# Patient Record
Sex: Female | Born: 1940 | Race: White | Hispanic: No | Marital: Married | Smoking: Former smoker
Health system: Southern US, Community
[De-identification: ages and names within clinical notes are randomized; demographics above are authoritative.]

## PROBLEM LIST (undated history)

## (undated) DIAGNOSIS — E785 Hyperlipidemia, unspecified: Secondary | ICD-10-CM

## (undated) DIAGNOSIS — E559 Vitamin D deficiency, unspecified: Secondary | ICD-10-CM

## (undated) DIAGNOSIS — D649 Anemia, unspecified: Secondary | ICD-10-CM

## (undated) DIAGNOSIS — B0050 Herpesviral ocular disease, unspecified: Secondary | ICD-10-CM

## (undated) DIAGNOSIS — M899 Disorder of bone, unspecified: Secondary | ICD-10-CM

## (undated) DIAGNOSIS — K219 Gastro-esophageal reflux disease without esophagitis: Secondary | ICD-10-CM

## (undated) DIAGNOSIS — F419 Anxiety disorder, unspecified: Principal | ICD-10-CM

## (undated) DIAGNOSIS — I2510 Atherosclerotic heart disease of native coronary artery without angina pectoris: Secondary | ICD-10-CM

## (undated) DIAGNOSIS — N200 Calculus of kidney: Secondary | ICD-10-CM

## (undated) DIAGNOSIS — M1990 Unspecified osteoarthritis, unspecified site: Secondary | ICD-10-CM

## (undated) DIAGNOSIS — M545 Low back pain: Secondary | ICD-10-CM

## (undated) DIAGNOSIS — I10 Essential (primary) hypertension: Secondary | ICD-10-CM

## (undated) DIAGNOSIS — M6740 Ganglion, unspecified site: Secondary | ICD-10-CM

## (undated) DIAGNOSIS — E039 Hypothyroidism, unspecified: Secondary | ICD-10-CM

## (undated) DIAGNOSIS — M949 Disorder of cartilage, unspecified: Secondary | ICD-10-CM

## (undated) DIAGNOSIS — L700 Acne vulgaris: Secondary | ICD-10-CM

## (undated) HISTORY — DX: Acne vulgaris: L70.0

## (undated) HISTORY — DX: Gastro-esophageal reflux disease without esophagitis: K21.9

## (undated) HISTORY — PX: CHOLECYSTECTOMY: SHX55

## (undated) HISTORY — DX: Ganglion, unspecified site: M67.40

## (undated) HISTORY — PX: ANGIOPLASTY: SHX39

## (undated) HISTORY — DX: Disorder of cartilage, unspecified: M94.9

## (undated) HISTORY — DX: Herpesviral ocular disease, unspecified: B00.50

## (undated) HISTORY — DX: Calculus of kidney: N20.0

## (undated) HISTORY — DX: Atherosclerotic heart disease of native coronary artery without angina pectoris: I25.10

## (undated) HISTORY — DX: Low back pain: M54.5

## (undated) HISTORY — DX: Disorder of bone, unspecified: M89.9

## (undated) HISTORY — DX: Vitamin D deficiency, unspecified: E55.9

## (undated) HISTORY — DX: Hypothyroidism, unspecified: E03.9

## (undated) HISTORY — DX: Essential (primary) hypertension: I10

## (undated) HISTORY — DX: Anemia, unspecified: D64.9

## (undated) HISTORY — DX: Unspecified osteoarthritis, unspecified site: M19.90

## (undated) HISTORY — DX: Hyperlipidemia, unspecified: E78.5

## (undated) HISTORY — DX: Anxiety disorder, unspecified: F41.9

---

## 2004-08-19 IMAGING — CR DG CHEST 2V
2 series · 2 of 2 positions shown · non-contrast
Comparison: none

CLINICAL DATA: Infected right elbow, preop respiratory exam

Chest 2 view

[view not recorded (1 of 2)]
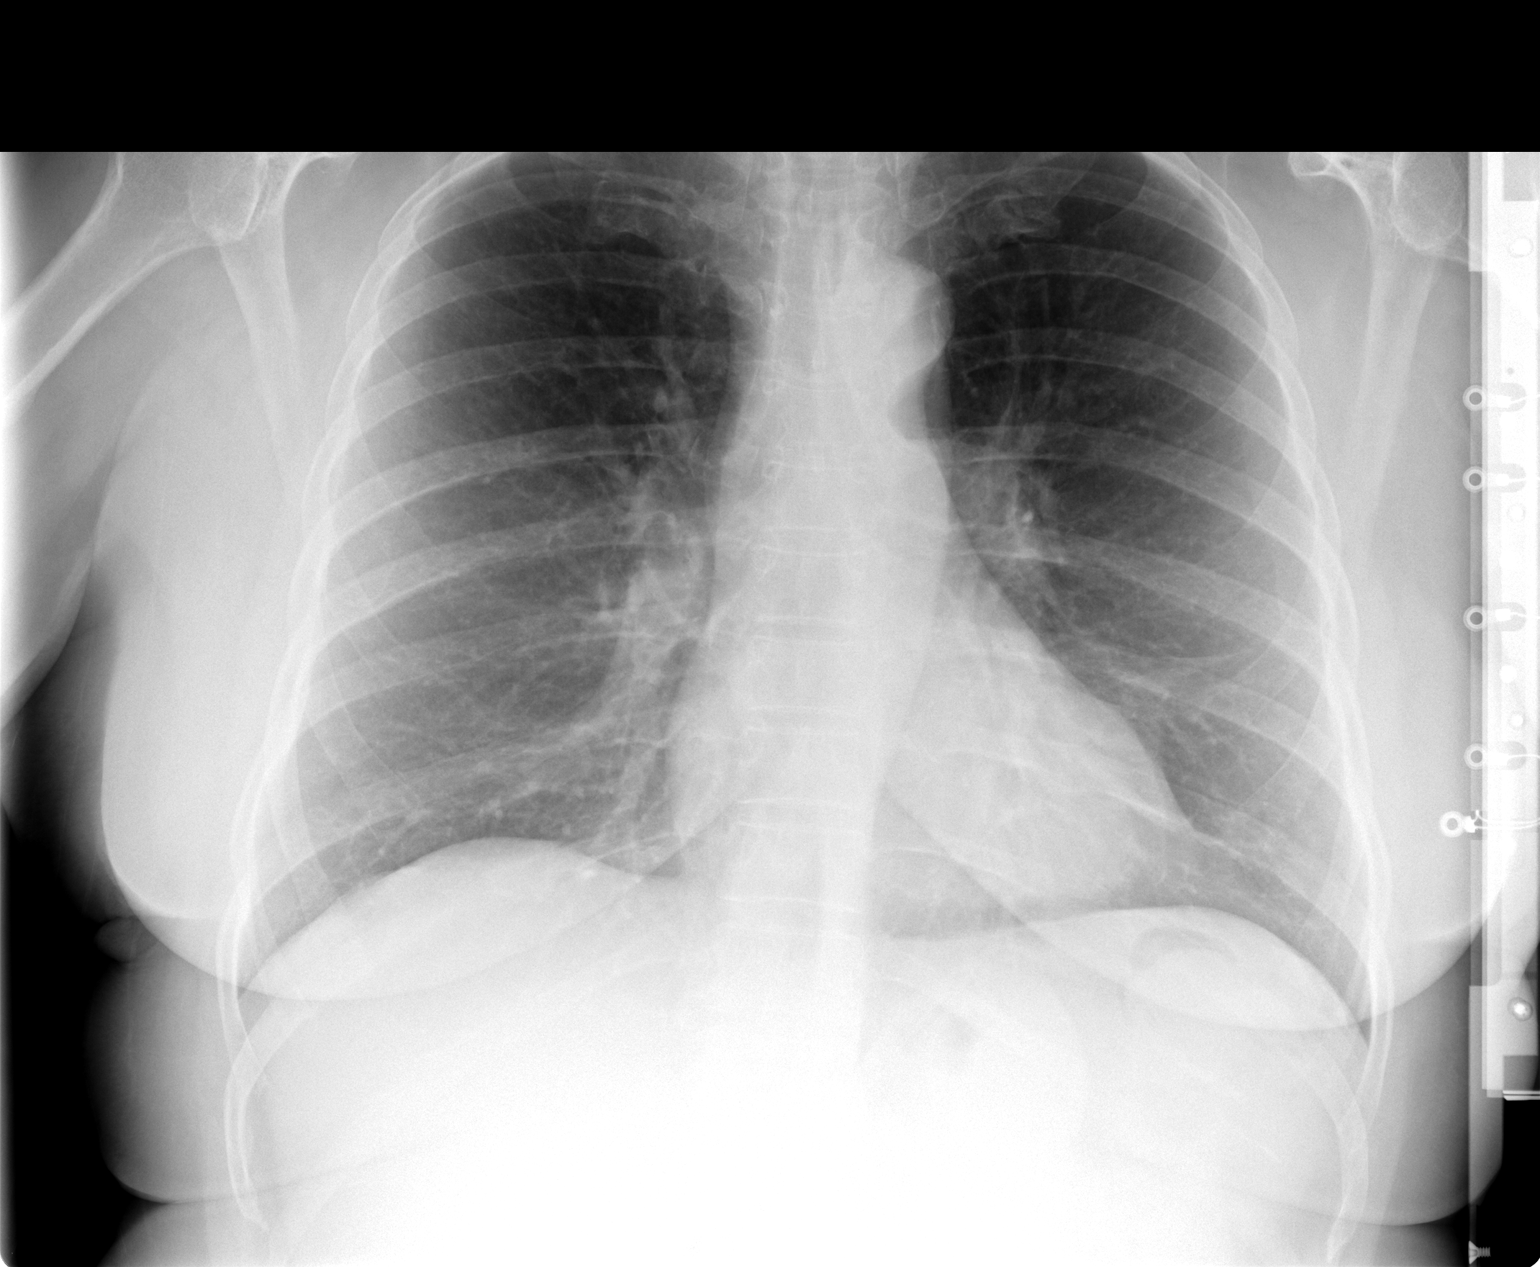

[view not recorded (2 of 2)]
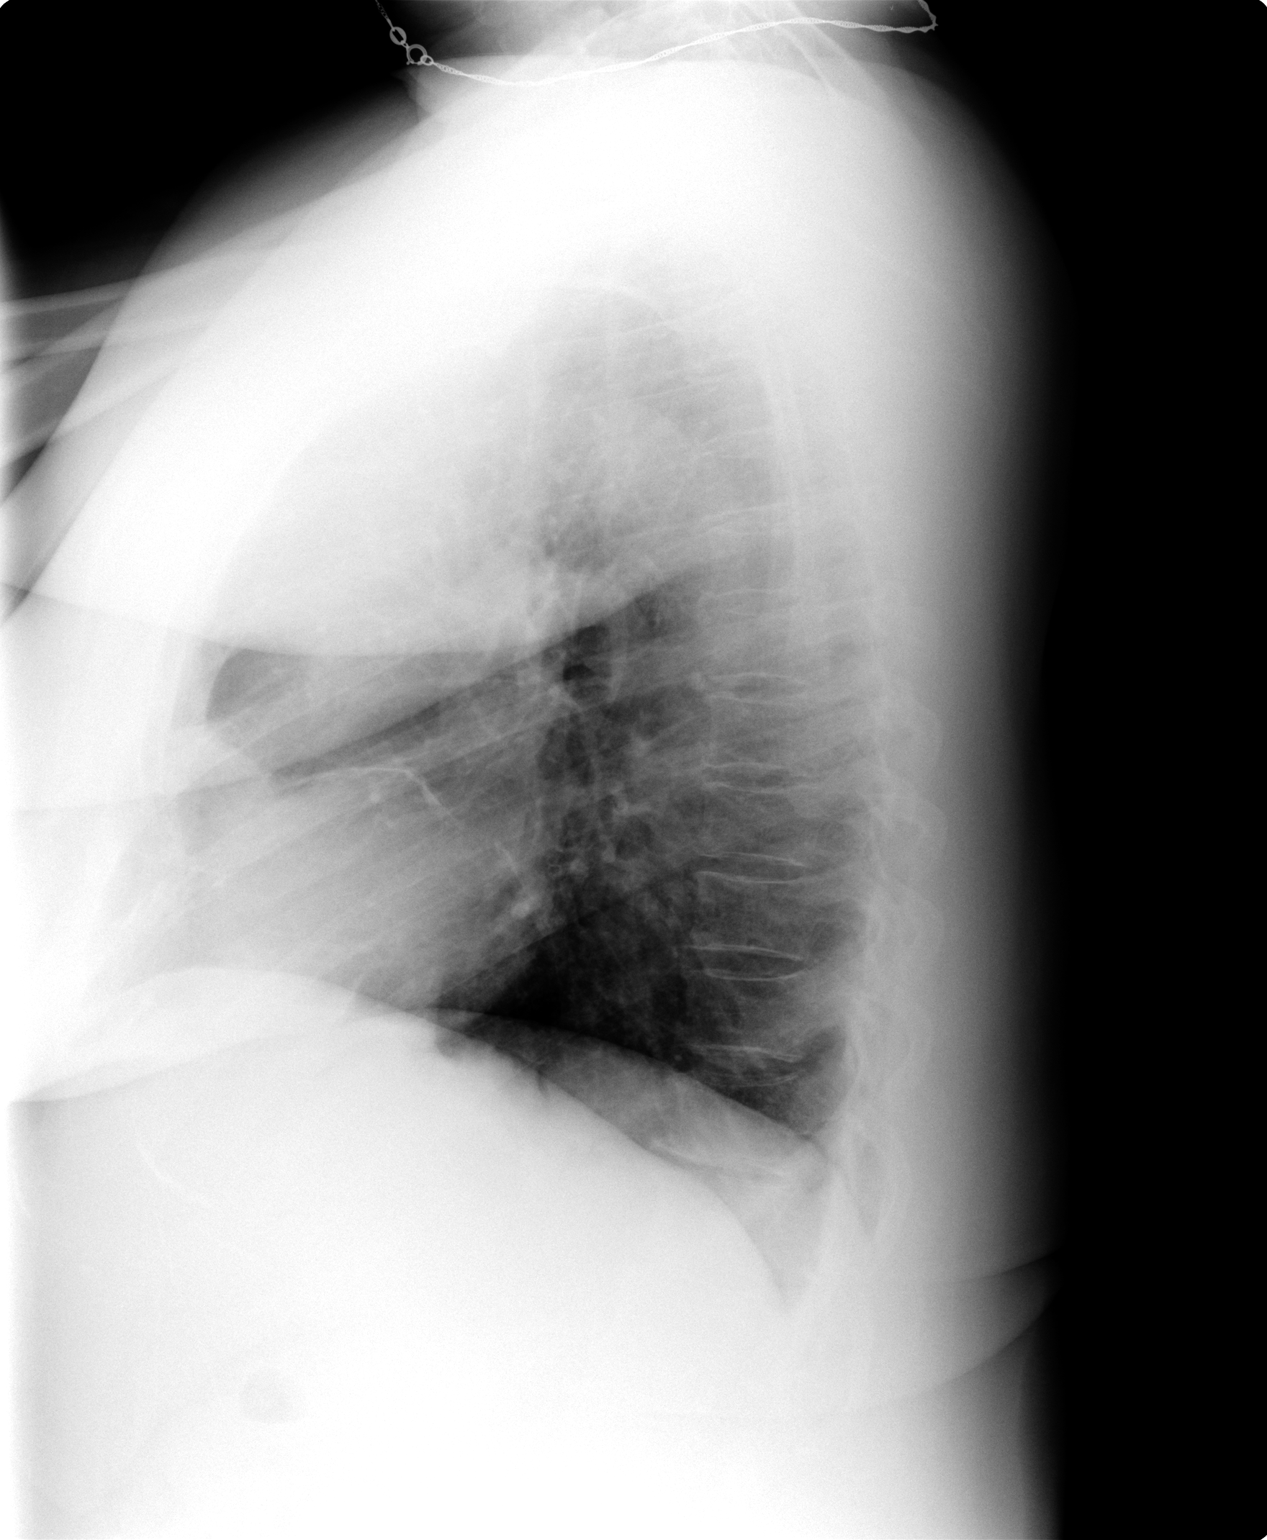

[2 of 2 positions shown; findings below may reference images not displayed]

FINDINGS: No previous for comparison. Linear scarring or atelectasis in the lingula. Heart size
normal. Mildly tortuous thoracic aorta. No effusion.
IMPRESSION: Minimal linear lingular scarring or atelectasis.

## 2004-08-21 ENCOUNTER — Inpatient Hospital Stay (HOSPITAL_COMMUNITY): Admission: RE | Admit: 2004-08-21 | Discharge: 2004-08-25 | Payer: Self-pay | Admitting: Orthopedic Surgery

## 2004-08-21 ENCOUNTER — Ambulatory Visit: Payer: Self-pay | Admitting: Infectious Diseases

## 2004-08-22 ENCOUNTER — Encounter: Payer: Self-pay | Admitting: Cardiovascular Disease

## 2005-10-20 ENCOUNTER — Ambulatory Visit: Payer: Self-pay | Admitting: Family Medicine

## 2005-11-03 ENCOUNTER — Other Ambulatory Visit: Admission: RE | Admit: 2005-11-03 | Discharge: 2005-11-03 | Payer: Self-pay | Admitting: Family Medicine

## 2005-11-03 ENCOUNTER — Encounter: Payer: Self-pay | Admitting: Family Medicine

## 2005-11-03 ENCOUNTER — Ambulatory Visit: Payer: Self-pay | Admitting: Family Medicine

## 2005-12-03 ENCOUNTER — Encounter: Admission: RE | Admit: 2005-12-03 | Discharge: 2005-12-03 | Payer: Self-pay | Admitting: Family Medicine

## 2005-12-08 ENCOUNTER — Ambulatory Visit: Payer: Self-pay | Admitting: Family Medicine

## 2005-12-10 ENCOUNTER — Encounter: Admission: RE | Admit: 2005-12-10 | Discharge: 2005-12-10 | Payer: Self-pay | Admitting: Family Medicine

## 2006-04-27 ENCOUNTER — Ambulatory Visit: Payer: Self-pay | Admitting: Family Medicine

## 2006-07-13 ENCOUNTER — Ambulatory Visit: Payer: Self-pay | Admitting: Family Medicine

## 2006-10-05 ENCOUNTER — Ambulatory Visit: Payer: Self-pay | Admitting: Family Medicine

## 2006-11-18 ENCOUNTER — Ambulatory Visit: Payer: Self-pay | Admitting: Family Medicine

## 2006-11-18 LAB — CONVERTED CEMR LAB
ALT: 21 units/L (ref 0–40)
AST: 24 units/L (ref 0–37)
BUN: 12 mg/dL (ref 6–23)
Basophils Absolute: 0.1 10*3/uL (ref 0.0–0.1)
Basophils Relative: 0.8 % (ref 0.0–1.0)
CO2: 26 meq/L (ref 19–32)
Calcium: 9.9 mg/dL (ref 8.4–10.5)
Chloride: 105 meq/L (ref 96–112)
Creatinine, Ser: 0.7 mg/dL (ref 0.4–1.2)
Glomerular Filtration Rate, Af Am: 109 mL/min/{1.73_m2}
HDL: 30.5 mg/dL — ABNORMAL LOW (ref >39.0)
Hemoglobin: 14.2 g/dL (ref 12.0–15.0)
LDL DIRECT: 191.6 mg/dL
MCV: 94.8 fL (ref 78.0–100.0)
Monocytes Relative: 6.4 % (ref 3.0–11.0)
Neutrophils Relative %: 66.8 % (ref 43.0–77.0)

## 2006-12-15 ENCOUNTER — Encounter: Admission: RE | Admit: 2006-12-15 | Discharge: 2006-12-15 | Payer: Self-pay | Admitting: Family Medicine

## 2007-02-18 ENCOUNTER — Ambulatory Visit: Payer: Self-pay | Admitting: Family Medicine

## 2007-02-18 LAB — CONVERTED CEMR LAB
ALT: 23 units/L (ref 0–40)
AST: 29 units/L (ref 0–37)
Total CHOL/HDL Ratio: 6.7
Triglycerides: 57 mg/dL (ref 0–149)

## 2007-06-07 ENCOUNTER — Ambulatory Visit: Payer: Self-pay | Admitting: Family Medicine

## 2007-06-07 LAB — CONVERTED CEMR LAB
ALT: 26 units/L (ref 0–35)
Direct LDL: 245.4 mg/dL
HDL: 31.2 mg/dL — ABNORMAL LOW (ref >39.0)
Triglycerides: 115 mg/dL (ref 0–149)

## 2007-07-12 ENCOUNTER — Telehealth: Payer: Self-pay | Admitting: Family Medicine

## 2007-10-11 ENCOUNTER — Telehealth: Payer: Self-pay | Admitting: Family Medicine

## 2007-11-09 ENCOUNTER — Telehealth: Payer: Self-pay | Admitting: Family Medicine

## 2007-12-20 ENCOUNTER — Encounter: Payer: Self-pay | Admitting: Family Medicine

## 2007-12-27 ENCOUNTER — Encounter: Admission: RE | Admit: 2007-12-27 | Discharge: 2007-12-27 | Payer: Self-pay | Admitting: Family Medicine

## 2008-05-08 ENCOUNTER — Telehealth: Payer: Self-pay | Admitting: Family Medicine

## 2008-07-31 ENCOUNTER — Telehealth: Payer: Self-pay | Admitting: Family Medicine

## 2008-08-23 ENCOUNTER — Ambulatory Visit: Payer: Self-pay | Admitting: Family Medicine

## 2008-08-23 ENCOUNTER — Other Ambulatory Visit: Admission: RE | Admit: 2008-08-23 | Discharge: 2008-08-23 | Payer: Self-pay | Admitting: Family Medicine

## 2008-08-23 ENCOUNTER — Encounter: Payer: Self-pay | Admitting: Family Medicine

## 2008-08-23 DIAGNOSIS — N390 Urinary tract infection, site not specified: Secondary | ICD-10-CM | POA: Insufficient documentation

## 2008-08-23 DIAGNOSIS — M899 Disorder of bone, unspecified: Secondary | ICD-10-CM | POA: Insufficient documentation

## 2008-08-23 DIAGNOSIS — E785 Hyperlipidemia, unspecified: Secondary | ICD-10-CM

## 2008-08-23 DIAGNOSIS — E782 Mixed hyperlipidemia: Secondary | ICD-10-CM | POA: Insufficient documentation

## 2008-08-23 DIAGNOSIS — D649 Anemia, unspecified: Secondary | ICD-10-CM

## 2008-08-23 DIAGNOSIS — T50995A Adverse effect of other drugs, medicaments and biological substances, initial encounter: Secondary | ICD-10-CM

## 2008-08-23 DIAGNOSIS — E039 Hypothyroidism, unspecified: Secondary | ICD-10-CM

## 2008-08-23 DIAGNOSIS — N951 Menopausal and female climacteric states: Secondary | ICD-10-CM | POA: Insufficient documentation

## 2008-08-23 DIAGNOSIS — M8580 Other specified disorders of bone density and structure, unspecified site: Secondary | ICD-10-CM | POA: Insufficient documentation

## 2008-08-23 DIAGNOSIS — K219 Gastro-esophageal reflux disease without esophagitis: Secondary | ICD-10-CM

## 2008-08-23 DIAGNOSIS — M949 Disorder of cartilage, unspecified: Secondary | ICD-10-CM

## 2008-08-23 DIAGNOSIS — I2510 Atherosclerotic heart disease of native coronary artery without angina pectoris: Secondary | ICD-10-CM

## 2008-08-23 HISTORY — DX: Anemia, unspecified: D64.9

## 2008-08-23 HISTORY — DX: Atherosclerotic heart disease of native coronary artery without angina pectoris: I25.10

## 2008-08-23 HISTORY — DX: Hyperlipidemia, unspecified: E78.5

## 2008-08-23 HISTORY — DX: Disorder of bone, unspecified: M89.9

## 2008-08-23 HISTORY — DX: Hypothyroidism, unspecified: E03.9

## 2008-08-23 HISTORY — DX: Gastro-esophageal reflux disease without esophagitis: K21.9

## 2008-08-24 LAB — CONVERTED CEMR LAB: Vit D, 1,25-Dihydroxy: 24 — ABNORMAL LOW (ref 30–89)

## 2008-09-05 LAB — CONVERTED CEMR LAB
AST: 25 units/L (ref 0–37)
Albumin: 4 g/dL (ref 3.5–5.2)
Alkaline Phosphatase: 92 units/L (ref 39–117)
BUN: 15 mg/dL (ref 6–23)
Basophils Absolute: 0 10*3/uL (ref 0.0–0.1)
Calcium: 9.4 mg/dL (ref 8.4–10.5)
GFR calc non Af Amer: 89 mL/min
HDL: 28.8 mg/dL — ABNORMAL LOW (ref >39.0)
Hemoglobin: 13.6 g/dL (ref 12.0–15.0)
MCV: 94.5 fL (ref 78.0–100.0)
Monocytes Absolute: 0.6 10*3/uL (ref 0.1–1.0)
Neutrophils Relative %: 54.7 % (ref 43.0–77.0)
Potassium: 4 meq/L (ref 3.5–5.1)
RDW: 14 % (ref 11.5–14.6)
Total CHOL/HDL Ratio: 7.7
Total Protein: 7 g/dL (ref 6.0–8.3)
Triglycerides: 141 mg/dL (ref 0–149)

## 2008-09-11 ENCOUNTER — Telehealth: Payer: Self-pay | Admitting: Family Medicine

## 2008-09-18 ENCOUNTER — Telehealth: Payer: Self-pay | Admitting: Family Medicine

## 2008-11-08 ENCOUNTER — Telehealth: Payer: Self-pay | Admitting: Family Medicine

## 2008-11-28 ENCOUNTER — Ambulatory Visit: Payer: Self-pay | Admitting: Family Medicine

## 2008-11-28 DIAGNOSIS — J209 Acute bronchitis, unspecified: Secondary | ICD-10-CM | POA: Insufficient documentation

## 2008-11-28 DIAGNOSIS — J029 Acute pharyngitis, unspecified: Secondary | ICD-10-CM

## 2008-12-05 ENCOUNTER — Ambulatory Visit: Payer: Self-pay | Admitting: Family Medicine

## 2008-12-05 DIAGNOSIS — K5289 Other specified noninfective gastroenteritis and colitis: Secondary | ICD-10-CM

## 2008-12-26 ENCOUNTER — Encounter: Payer: Self-pay | Admitting: Family Medicine

## 2009-01-03 ENCOUNTER — Telehealth: Payer: Self-pay | Admitting: Family Medicine

## 2009-01-15 ENCOUNTER — Encounter: Admission: RE | Admit: 2009-01-15 | Discharge: 2009-01-15 | Payer: Self-pay | Admitting: Family Medicine

## 2009-09-25 ENCOUNTER — Telehealth: Payer: Self-pay | Admitting: Family Medicine

## 2009-10-08 ENCOUNTER — Telehealth: Payer: Self-pay | Admitting: Family Medicine

## 2010-01-20 ENCOUNTER — Encounter: Admission: RE | Admit: 2010-01-20 | Discharge: 2010-01-20 | Payer: Self-pay | Admitting: Family Medicine

## 2010-01-20 LAB — HM MAMMOGRAPHY

## 2010-01-21 ENCOUNTER — Ambulatory Visit: Payer: Self-pay | Admitting: Family Medicine

## 2010-01-21 DIAGNOSIS — E669 Obesity, unspecified: Secondary | ICD-10-CM | POA: Insufficient documentation

## 2010-02-03 ENCOUNTER — Encounter: Payer: Self-pay | Admitting: Family Medicine

## 2010-02-03 ENCOUNTER — Encounter: Admission: RE | Admit: 2010-02-03 | Discharge: 2010-02-03 | Payer: Self-pay | Admitting: Family Medicine

## 2010-02-18 ENCOUNTER — Other Ambulatory Visit: Admission: RE | Admit: 2010-02-18 | Discharge: 2010-02-18 | Payer: Self-pay | Admitting: Family Medicine

## 2010-02-18 ENCOUNTER — Ambulatory Visit: Payer: Self-pay | Admitting: Family Medicine

## 2010-02-18 DIAGNOSIS — M545 Low back pain, unspecified: Secondary | ICD-10-CM

## 2010-02-18 HISTORY — DX: Low back pain, unspecified: M54.50

## 2010-02-18 LAB — CONVERTED CEMR LAB
Bilirubin Urine: NEGATIVE
pH: 5.5

## 2010-02-20 LAB — CONVERTED CEMR LAB
ALT: 21 units/L (ref 0–35)
Albumin: 3.8 g/dL (ref 3.5–5.2)
Basophils Absolute: 0.1 10*3/uL (ref 0.0–0.1)
Basophils Relative: 0.9 % (ref 0.0–3.0)
Bilirubin, Direct: 0.1 mg/dL (ref 0.0–0.3)
Creatinine, Ser: 0.5 mg/dL (ref 0.4–1.2)
Eosinophils Absolute: 0.2 10*3/uL (ref 0.0–0.7)
Eosinophils Relative: 2.8 % (ref 0.0–5.0)
GFR calc non Af Amer: 130.92 mL/min (ref >60)
Glucose, Bld: 89 mg/dL (ref 70–99)
HDL: 35.6 mg/dL — ABNORMAL LOW (ref >39.00)
Lymphocytes Relative: 29.3 % (ref 12.0–46.0)
Lymphs Abs: 1.9 10*3/uL (ref 0.7–4.0)
MCHC: 33.7 g/dL (ref 30.0–36.0)
Monocytes Absolute: 0.5 10*3/uL (ref 0.1–1.0)
Monocytes Relative: 8.2 % (ref 3.0–12.0)
Neutrophils Relative %: 58.8 % (ref 43.0–77.0)
Platelets: 239 10*3/uL (ref 150.0–400.0)
Potassium: 4 meq/L (ref 3.5–5.1)
RDW: 13.1 % (ref 11.5–14.6)
Total Bilirubin: 0.5 mg/dL (ref 0.3–1.2)
Total CHOL/HDL Ratio: 6
Total Protein: 7.1 g/dL (ref 6.0–8.3)
VLDL: 40.2 mg/dL — ABNORMAL HIGH (ref 0.0–40.0)
Vit D, 25-Hydroxy: 19 ng/mL — ABNORMAL LOW (ref 30–89)

## 2010-02-25 ENCOUNTER — Encounter: Payer: Self-pay | Admitting: Family Medicine

## 2010-06-04 ENCOUNTER — Telehealth: Payer: Self-pay | Admitting: Family Medicine

## 2010-06-10 ENCOUNTER — Telehealth: Payer: Self-pay | Admitting: Family Medicine

## 2010-06-17 ENCOUNTER — Telehealth: Payer: Self-pay | Admitting: Family Medicine

## 2010-07-31 ENCOUNTER — Telehealth: Payer: Self-pay | Admitting: Family Medicine

## 2010-08-19 ENCOUNTER — Encounter: Payer: Self-pay | Admitting: Family Medicine

## 2010-08-21 ENCOUNTER — Encounter: Payer: Self-pay | Admitting: Family Medicine

## 2010-08-23 HISTORY — PX: CORONARY STENT PLACEMENT: SHX1402

## 2010-09-15 ENCOUNTER — Encounter: Payer: Self-pay | Admitting: Family Medicine

## 2010-12-14 ENCOUNTER — Encounter: Payer: Self-pay | Admitting: Family Medicine

## 2010-12-24 NOTE — Progress Notes (Signed)
Summary: Pt called and said Estradol not strong enough.   Phone Note Call from Patient Call back at [facility] Phone [phone]   Caller: Patient Summary of Call: Pt called and said that the Katrina Stack is not strong enough. Pt has taken 2 at a time and it still doesn't help hot flashes.  Pt req a different med that is stronger.  [facility]s [facility] in to [facility] [phone]     Initial call taken by: Lucy Antigua,  June 10, 2010 1:37 PM  Follow-up for Phone Call        per dr Scotty Court ok to take 2 once daily and he said give it at least 3 weeks before deciding it doesn't work  Follow-up by: Pura Spice, RN,  June 10, 2010 2:45 PM

## 2010-12-24 NOTE — Assessment & Plan Note (Signed)
Summary: MED CK (REFILLS) // RS appt 10.30am/njr   Vital Signs:  Patient profile:   70 year old female Weight:      188 pounds O2 Sat:      98 % Temp:     98.1 degrees F Pulse rate:   70 / minute Pulse rhythm:   regular BP sitting:   160 / 92  (left arm) Cuff size:   large  Vitals Entered By: Pura Spice, RN (January 21, 2010 9:38 AM) CC: wants  refills  Is Patient Diabetic? 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  Social History: Last updated: 06/14/2007 Retired Married Alcohol use-yes Drug use-no Regular exercise-yes  Risk Factors: Smoking Status: quit > 6 months (01/21/2010)  Social History: Smoking Status:  quit > 6 months  Review of Systems  The patient denies anorexia, fever, weight loss, weight gain, vision loss, decreased hearing, hoarseness, chest pain, syncope, dyspnea on exertion, peripheral edema, prolonged cough, headaches, hemoptysis, abdominal pain, melena, hematochezia, severe indigestion/heartburn, hematuria, incontinence, genital sores, muscle weakness, suspicious skin lesions, transient blindness, difficulty walking, depression, unusual weight change, abnormal bleeding, enlarged lymph nodes, angioedema, breast masses, and testicular masses.    Physical Exam  General:  Well-developed,well-nourished,in no acute distress; alert,appropriate and cooperative throughout examination Lungs:  Normal respiratory  effort, chest expands symmetrically. Lungs are clear to auscultation, no crackles or wheezes. Heart:  Normal rate and regular rhythm. S1 and S2 normal without gallop, murmur, click, rub or other extra sounds. Extremities:  No clubbing, cyanosis, edema, or deformity noted with normal full range of motion of all joints.     Impression & Recommendations:  Problem # 1:  EXOGENOUS OBESITY (ICD-278.00) Assessment New discussed weight reduction and to prescribe phentermine 37.5 mg q.d. for 3 months decreased appetite  Problem # 2:  GERD (ICD-530.81) Assessment: Improved  Problem # 3:  CORONARY ARTERY DISEASE (ICD-414.00) Assessment: Improved  The following medications were removed from the medication list:    Atenolol 50 Mg Tabs (Atenolol) .Marland Kitchen... Take 1 tablet by mouth once a day Her updated medication list for this problem includes:    Cartia Xt 240 Mg Cp24 (Diltiazem hcl coated beads) ..... Once daily    Adult Aspirin Ec Low Strength 81 Mg Tbec (Aspirin) .Marland Kitchen... 1 qd    Atenolol 50 Mg Tabs (Atenolol) .Marland Kitchen... 1 qd  Problem # 4:  HYPERLIPIDEMIA (ICD-272.4) Assessment: Unchanged  The following medications were removed from the medication list:    Zetia 10 Mg Tabs (Ezetimibe) .Marland Kitchen... 1 by mouth once daily Her updated medication list for this problem includes:    Simvastatin 80 Mg Tabs (Simvastatin) .Marland Kitchen... 1 by mouth once daily  Complete Medication List: 1)  Phentermine Hcl 37.5 Mg Caps (Phentermine hcl) .... Once daily to decrease appetite 2)  Acyclovir 400 Mg Tabs (Acyclovir) .... Two times a day 3)  Cartia Xt 240 Mg Cp24 (Diltiazem hcl coated beads) .... Once daily 4)  Diclofenac Sodium 75 Mg Tbec (Diclofenac sodium) .Marland KitchenMarland KitchenMarland Kitchen  1 two times a day pc 5)  Adult Aspirin Ec Low Strength 81 Mg Tbec (Aspirin) .Marland Kitchen.. 1 qd 6)  Lorcet Plus 7.5-650 Mg Tabs (Hydrocodone-acetaminophen) .Marland Kitchen.. 1 tab q4h as needed pain needs office visit 7)  Simvastatin 80 Mg Tabs (Simvastatin) .Marland Kitchen.. 1 by mouth once daily 8)   Atenolol 50 Mg Tabs (Atenolol) .Marland Kitchen.. 1 qd  Patient Instructions: 1)  You need to lose weight. Consider a lower calorie diet and regular exercise.  2)  refill medications and would like for you to return for full violation and laboratory studies 3)  Very pleased that she stopped smoking 6 months ago Prescriptions: PHENTERMINE HCL 37.5 MG  CAPS (PHENTERMINE HCL) once daily to decrease appetite  #30 x 3   Entered and Authorized by:   Judithann Sheen MD   Signed by:   Judithann Sheen MD on 01/21/2010   Method used:   Print then Give to Patient   RxID:   [id number] SIMVASTATIN 80 MG TABS (SIMVASTATIN) 1 by mouth once daily  #90 x 3   Entered and Authorized by:   Judithann Sheen MD   Signed by:   Judithann Sheen MD on 01/21/2010   Method used:   Electronically to        [facility]  [phone]* (retail)       [address] [place], [place]  [address]       Ph: [phone] or [phone]       Fax: [phone]   RxID:   [id number] DICLOFENAC SODIUM 75 MG TBEC (DICLOFENAC SODIUM) 1 two times a day pc  #180 x 3   Entered and Authorized by:   Judithann Sheen MD   Signed by:   Judithann Sheen MD on 01/21/2010   Method used:   Electronically to        [facility]  [phone]* (retail)       [address] [place], [place]  [address]       Ph: [phone] or [phone]       Fax: [phone]   RxID:   [id number] CARTIA XT 240 MG  CP24 (DILTIAZEM HCL COATED BEADS) once daily  #90 x 3   Entered and Authorized by:   Judithann Sheen MD   Signed by:   Judithann Sheen MD on 01/21/2010   Method used:   Electronically to        [facility]  [phone]* (retail)       [address] [place], [place]  [address]       Ph: [phone] or [phone]       Fax: [phone]   RxID:   [id number] ACYCLOVIR 400 MG  TABS (ACYCLOVIR) two times a day  #180 x 3   Entered and Authorized by:   Judithann Sheen MD   Signed by:   Judithann Sheen MD on 01/21/2010   Method used:   Electronically to        [facility]  [phone]* (retail)       [address] [place], [place]  [address]       Ph: [phone] or [phone]  Fax: [phone]   RxID:   [id number] ATENOLOL 50 MG TABS (ATENOLOL) 1 qd  #90 x 3   Entered and Authorized by:   Judithann Sheen MD   Signed by:   Judithann Sheen MD on 01/21/2010   Method used:   Electronically to        [facility]  [phone]* (retail)       [address] [place], [place]  [address]       Ph: [phone] or [phone]       Fax: [phone]   RxID:   [id number]

## 2010-12-24 NOTE — Progress Notes (Signed)
Summary: REFILL REQUEST  Phone Note Call from Patient   Caller: Patient  [phone] Summary of Call: Pt called and requests a Rx for med to help with night sweats due to menopause.... Pt adv that Dr Scotty Court prescribed med (Estradiol 2mg )  and it seems to help with daytime hot flashes but doesn't help at night, she is still waking up with the sheets soaking wet - would like something (increased dosage?) to help with relief of same...Marland KitchenMarland KitchenMarland Kitchen [facility].  Initial call taken by: Debbra Riding,  July 31, 2010 9:19 AM  Follow-up for Phone Call        I am not comfortable increasing this med in further without a physical exam and possible labs to make sure she is tolerating it well and her vitals have not changed. I would be willing to refill the same dose temporarily if she wants, 30#, 1rf Follow-up by: Danise Edge MD,  July 31, 2010 1:02 PM  Additional Follow-up for Phone Call Additional follow up Details #1::        Patient notified and states that she already has one refill of the current dosage. Patient is aware that Dr. Scotty Court will return in Oct. and she will be out of town next week, so she will continue on the current dosage and call for an appt with either Scotty Court or Kalida when she returns. Lucious Groves CMA  July 31, 2010 3:02 PM

## 2010-12-24 NOTE — Miscellaneous (Signed)
Summary: Flu Vaccnination/[facility]  Flu Vaccnination/[facility]   Imported By: Sherian Rein 05/03/2010 10:06:38  _____________________________________________________________________  External Attachment:    Type:   Image     Comment:   External Document

## 2010-12-24 NOTE — Progress Notes (Signed)
Summary: new rx estradiol   Phone Note Call from Patient   Caller: Patient Call For: Judithann Sheen MD Summary of Call: Pt is requesting RX for hot flashes.  Please give it to her husband when he comes today. Initial call taken by: Lynann Beaver CMA,  June 04, 2010 9:59 AM  Follow-up for Phone Call        called by dr Scotty Court.  Follow-up by: Pura Spice, RN,  June 05, 2010 8:56 AM

## 2010-12-24 NOTE — Letter (Signed)
Summary: [facility]  [facility]   Imported By: Maryln Gottron 08/27/2010 13:31:07  _____________________________________________________________________  External Attachment:    Type:   Image     Comment:   External Document

## 2010-12-24 NOTE — Consult Note (Signed)
Summary: [facility]   Imported By: Maryln Gottron 09/22/2010 15:54:29  _____________________________________________________________________  External Attachment:    Type:   Image     Comment:   External Document

## 2010-12-24 NOTE — Assessment & Plan Note (Signed)
Summary: pt will come in fasting/njr   Vital Signs:  Patient profile:   70 year old female Height:      63 inches Weight:      187 pounds BMI:     33.25 O2 Sat:      98 % Temp:     98 degrees F Pulse rate:   76 / minute Pulse rhythm:   regular BP sitting:   120 / 80  (left arm)  Vitals Entered By: Pura Spice, RN (February 18, 2010 10:12 AM) CC: Refills discuss problems and fasting for labs needs pap    History of Present Illness: This 70 year old white married female is in to discuss her medical problems as well as refill her necessary medications. She also would like to discuss her bone density test which indicates she needs to start on Actonel plus continue on calcium with vitamin D Using Chantix she has stopped smoking as well as her to stop her In general patient feels fine except continues to have low back pain which he has had chronic back pain for several years. For that reason she needs hydrocodone at times. She is on Aleve 3 tablets twice daily Her cardiologist is in current level, Dr. Marcelle Overlie. She has had her colonoscopic exam up-to-date and the neck scheduled as 2017. The patient is on minocycline daily by dermatologist Dr. 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  Family History: Last updated: 06/14/2007 Family History of Arthritis Family History High cholesterol Family History Hypertension Family History Other cancer Family History of Sudden Death Family History of Cardiovascular disorder  Social History: Last updated: 06/14/2007 Retired Married Alcohol use-yes Drug use-no Regular exercise-yes  Risk Factors: Smoking Status: quit > 6 months (01/21/2010)  Review of Systems  The patient denies anorexia, fever, weight loss, weight gain, vision loss, decreased hearing, hoarseness, chest pain, syncope, dyspnea on  exertion, peripheral edema, prolonged cough, headaches, hemoptysis, abdominal pain, melena, hematochezia, severe indigestion/heartburn, hematuria, incontinence, genital sores, muscle weakness, suspicious skin lesions, transient blindness, difficulty walking, depression, unusual weight change, abnormal bleeding, enlarged lymph nodes, angioedema, breast masses, and testicular masses.    Physical Exam  General:  Well-developed,well-nourished,in no acute distress; alert,appropriate and cooperative throughout examination Head:  Normocephalic and atraumatic without obvious abnormalities. No apparent alopecia or balding. Eyes:  No corneal or conjunctival inflammation noted. EOMI. Perrla. Funduscopic exam benign, without hemorrhages, exudates or papilledema. Vision grossly normal. Ears:  External ear exam shows no significant lesions or deformities.  Otoscopic examination reveals clear canals, tympanic membranes are intact bilaterally without bulging, retraction, inflammation or discharge. Hearing is grossly normal bilaterally. Nose:  External nasal examination shows no deformity or inflammation. Nasal mucosa are pink and moist without lesions or exudates. Mouth:  Oral mucosa and oropharynx without lesions or exudates.  Teeth in good repair. Neck:  No deformities, masses, or tenderness noted. Chest Wall:  No deformities, masses, or tenderness noted. Breasts:  No mass, nodules, thickening, tenderness, bulging, retraction, inflamation, nipple discharge or skin changes noted.   Lungs:  Normal respiratory effort, chest expands symmetrically. Lungs are clear to auscultation, no crackles or wheezes. Heart:  Normal rate and regular rhythm. S1 and S2 normal without gallop, murmur, click, rub or other extra sounds. Abdomen:  Bowel sounds positive,abdomen soft and non-tender without masses, organomegaly or hernias noted. Rectal:  No external abnormalities noted. Normal sphincter tone. No rectal masses  or  tenderness. Genitalia:  Normal introitus for age, no external lesions, no vaginal discharge, mucosa pink and moist, no vaginal or cervical lesions, no vaginal atrophy, no friaility or hemorrhage, normal uterus size and position, no adnexal masses or tenderness Msk:  No deformity or scoliosis noted of thoracic or lumbar spine.   Pulses:  R and L carotid,radial,femoral,dorsalis pedis and posterior tibial pulses are full and equal bilaterally Extremities:  No clubbing, cyanosis, edema, or deformity noted with normal full range of motion of all joints.   Neurologic:  No cranial nerve deficits noted. Station and gait are normal. Plantar reflexes are down-going bilaterally. DTRs are symmetrical throughout. Sensory, motor and coordinative functions appear intact. Skin:  mild ACNE Cervical Nodes:  No lymphadenopathy noted Axillary Nodes:  No palpable lymphadenopathy Inguinal Nodes:  No significant adenopathy Psych:  Cognition and judgment appear intact. Alert and cooperative with normal attention span and concentration. No apparent delusions, illusions, hallucinations   Impression & Recommendations:  Problem # 1:  BACK PAIN, LUMBAR, CHRONIC (ICD-724.2) Assessment Unchanged  Her updated medication list for this problem includes:    Diclofenac Sodium 75 Mg Tbec (Diclofenac sodium) .Marland Kitchen... 1 two times a day pc    Adult Aspirin Ec Low Strength 81 Mg Tbec (Aspirin) .Marland Kitchen... 1 qd    Lorcet Plus 7.5-650 Mg Tabs (Hydrocodone-acetaminophen) .Marland Kitchen... 1 tab q4h as needed pain needs office visit  Problem # 2:  EXOGENOUS OBESITY (ICD-278.00) Assessment: Unchanged low caloric diet plus phenteramine 37.5 each a.m. to reduce  for 3 months  Problem # 3:  GERD (ICD-530.81) Assessment: Improved  Problem # 4:  CORONARY ARTERY DISEASE (ICD-414.00) Assessment: Improved  Her updated medication list for this problem includes:    Cartia Xt 240 Mg Cp24 (Diltiazem hcl coated beads) ..... Once daily    Adult Aspirin Ec Low  Strength 81 Mg Tbec (Aspirin) .Marland Kitchen... 1 qd    Atenolol 50 Mg Tabs (Atenolol) .Marland Kitchen... 1 qd  Problem # 5:  OSTEOPENIA (ICD-733.90) Assessment: Deteriorated  Her updated medication list for this problem includes:    Actonel 150 Mg Tabs (Risedronate sodium) .Marland Kitchen... 1 tab monthyly as prescirbed    Vitamin D (ergocalciferol) 50000 Unit Caps (Ergocalciferol) .Marland Kitchen... 1 weekly x 12 weeks  Orders: T-Vitamin D (25-Hydroxy) [phone]) Prescription Created Electronically [phone])  Complete Medication List: 1)  Phentermine Hcl 37.5 Mg Caps (Phentermine hcl) .... Once daily to decrease appetite 2)  Acyclovir 400 Mg Tabs (Acyclovir) .... Two times a day 3)  Cartia Xt 240 Mg Cp24 (Diltiazem hcl coated beads) .... Once daily 4)  Diclofenac Sodium 75 Mg Tbec (Diclofenac sodium) .Marland Kitchen.. 1 two times a day pc 5)  Adult Aspirin Ec Low Strength 81 Mg Tbec (Aspirin) .Marland Kitchen.. 1 qd 6)  Lorcet Plus 7.5-650 Mg Tabs (Hydrocodone-acetaminophen) .Marland Kitchen.. 1 tab q4h as needed pain needs office visit 7)  Simvastatin 80 Mg Tabs (Simvastatin) .Marland Kitchen.. 1 by mouth once daily 8)  Atenolol 50 Mg Tabs (Atenolol) .Marland Kitchen.. 1 qd 9)  Minocycline Hcl 50 Mg Caps (Minocycline hcl) .... Dr Demetrio Lapping 10)  Actonel 150 Mg Tabs (Risedronate sodium) .Marland Kitchen.. 1 tab monthyly as prescirbed 11)  Vitamin D (ergocalciferol) 50000 Unit Caps (Ergocalciferol) .Marland KitchenMarland KitchenMarland Kitchen 1 weekly x 12 weeks  Other Orders: Venipuncture ([address]) UA Dipstick w/o Micro (automated)  (81003) Zoster (Shingles) Vaccine Live (30865) Admin 1st Vaccine (78469) TLB-Lipid Panel (80061-LIPID) TLB-BMP (Basic Metabolic Panel-BMET) (80048-METABOL) TLB-CBC Platelet - w/Differential (85025-CBCD) TLB-Hepatic/Liver Function Pnl (80076-HEPATIC) TLB-TSH (Thyroid Stimulating Hormone) (62952-WUX)  Patient Instructions: 1)  Doing fine  2)  pleased you have stopped smoking for9 months 3)  start  actonel plus 4)  calcium 600 mg ...........vitD 400mg  twice per day 5)  continue regular medications 6)  will call lab  results 7)  You need to lose weight. Consider a lower calorie diet and regular exercise.  8)  Phenteramine 37.5 mg each morning to decrease appetite Prescriptions: VITAMIN D (ERGOCALCIFEROL) 50000 UNIT CAPS (ERGOCALCIFEROL) 1 weekly x 12 weeks  #12 x 1   Entered and Authorized by:   Judithann Sheen MD   Signed by:   Judithann Sheen MD on 02/20/2010   Method used:   Electronically to        [facility]  [phone]* (retail)       [address] [place], [place]  [address]       Ph: [phone] or [phone]       Fax: [phone]   RxID:   [id number] ACTONEL 150 MG TABS (RISEDRONATE SODIUM) 1 tab monthyly as prescirbed  #1 x 11   Entered and Authorized by:   Judithann Sheen MD   Signed by:   Judithann Sheen MD on 02/18/2010   Method used:   Electronically to        [facility]  [facility]  [phone]* (retail)       [address] [place], [place]  [address]       Ph: [phone] or [phone]       Fax: [phone]   RxID:   [id number]    Immunizations Administered:  Zostavax # 1:    Vaccine Type: Zostavax    Site: left deltoid    Mfr: Merck    Dose: 0.5 ml    Route: [place]    Given by: Pura Spice, RN    Exp. Date: 12/11/2010    Lot #: [id number]   Laboratory Results   Urine Tests    Routine Urinalysis   Color: yellow Appearance: Clear Glucose: negative   (Normal Range: Negative) Bilirubin: negative   (Normal Range: Negative) Ketone: negative   (Normal Range: Negative) Spec. Gravity: 1.025   (Normal Range: 1.003-1.035) Blood: trace-intact   (Normal Range: Negative) pH: 5.5   (Normal Range: 5.0-8.0) Protein: 1+   (Normal Range: Negative) Urobilinogen: 0.2   (Normal Range: 0-1) Nitrite: negative   (Normal Range: Negative) Leukocyte Esterace: negative   (Normal Range: Negative)    Comments: Rita Ohara  February 18, 2010 1:35 PM

## 2010-12-24 NOTE — Letter (Signed)
Summary: Results Follow-up Letter  [place] [facility]  [address] [place], [place] [address]   Phone: [phone]  Fax: [phone]    02/25/2010  [address] [place], [place]  [address]  Dear Ms. Vancleve,   The following are the results of your recent test(s):  Test     Result     Pap Smear    Normal_______    _________________________________________________________  Sincerely, DR Raoul Pitch MD  [place] [facility]

## 2010-12-24 NOTE — Progress Notes (Signed)
Summary: Hot Flashes Medication  Phone Note Call from Patient Call back at Home Phone [phone]   Caller: Patient Summary of Call: Wants Dr. Scotty Court to call her in something stronger for her hot flashes. Currents meds. are not strong enough. [facility] [phone] Initial call taken by: Georgian Co,  June 17, 2010 2:33 PM  Follow-up for Phone Call        take estradiol 2mg  for 1 month then call after month  pt aware. Follow-up by: Pura Spice, RN,  June 17, 2010 5:03 PM    New/Updated Medications: ESTRACE 2 MG TABS (ESTRADIOL) 1 by mouth once daily Prescriptions: ESTRACE 2 MG TABS (ESTRADIOL) 1 by mouth once daily  #30 x 1   Entered by:   Pura Spice, RN   Authorized by:   Judithann Sheen MD   Signed by:   Pura Spice, RN on 06/17/2010   Method used:   Electronically to        [facility]  [phone]* (retail)       [address] [place], [place]  [address]       Ph: [phone] or [phone]       Fax: [phone]   RxID:   [id number]

## 2010-12-25 NOTE — Cardiovascular Report (Signed)
Summary: Cath / [facility] Other  Cath / [facility] Other   Imported By: Lennie Odor 11/11/2010 11:39:39  _____________________________________________________________________  External Attachment:    Type:   Image     Comment:   External Document  Appended Document: Cath / [facility] Other reviewed, vessel treated with stent

## 2011-01-21 ENCOUNTER — Other Ambulatory Visit: Payer: Self-pay | Admitting: Family Medicine

## 2011-01-21 DIAGNOSIS — Z1231 Encounter for screening mammogram for malignant neoplasm of breast: Secondary | ICD-10-CM

## 2011-01-21 NOTE — Telephone Encounter (Signed)
Pt called and said  [facility] has sent over a refill req for pts Atenolol 50 mg a few days ago, with no response. Pt is out of med and needs this called in today. Pls notify pt when this has been done.

## 2011-01-27 ENCOUNTER — Ambulatory Visit
Admission: RE | Admit: 2011-01-27 | Discharge: 2011-01-27 | Disposition: A | Discharge status: Home / Self Care | Payer: Medicare Other | Source: Ambulatory Visit | Attending: Family Medicine | Admitting: Family Medicine

## 2011-01-27 DIAGNOSIS — Z1231 Encounter for screening mammogram for malignant neoplasm of breast: Secondary | ICD-10-CM

## 2011-02-02 ENCOUNTER — Other Ambulatory Visit: Payer: Self-pay | Admitting: Family Medicine

## 2011-03-02 ENCOUNTER — Encounter: Payer: Self-pay | Admitting: Family Medicine

## 2011-03-03 ENCOUNTER — Encounter: Payer: Self-pay | Admitting: Family Medicine

## 2011-03-03 ENCOUNTER — Ambulatory Visit (INDEPENDENT_AMBULATORY_CARE_PROVIDER_SITE_OTHER): Payer: Medicare Other | Admitting: Family Medicine

## 2011-03-03 VITALS — BP 134/78 | HR 66 | Temp 98.6°F | Ht 63.0 in | Wt 185.0 lb

## 2011-03-03 DIAGNOSIS — B009 Herpesviral infection, unspecified: Secondary | ICD-10-CM

## 2011-03-03 DIAGNOSIS — G8929 Other chronic pain: Secondary | ICD-10-CM

## 2011-03-03 DIAGNOSIS — I2510 Atherosclerotic heart disease of native coronary artery without angina pectoris: Secondary | ICD-10-CM

## 2011-03-03 DIAGNOSIS — E669 Obesity, unspecified: Secondary | ICD-10-CM

## 2011-03-03 DIAGNOSIS — M545 Low back pain, unspecified: Secondary | ICD-10-CM

## 2011-03-03 DIAGNOSIS — M1990 Unspecified osteoarthritis, unspecified site: Secondary | ICD-10-CM

## 2011-03-03 DIAGNOSIS — E6609 Other obesity due to excess calories: Secondary | ICD-10-CM

## 2011-03-03 DIAGNOSIS — M129 Arthropathy, unspecified: Secondary | ICD-10-CM

## 2011-03-03 MED ORDER — DICLOFENAC SODIUM 75 MG PO TBEC: 75.0000 mg | DELAYED_RELEASE_TABLET | Freq: Two times a day (BID) | ORAL | Status: DC

## 2011-03-03 MED ORDER — ACYCLOVIR 400 MG PO TABS: 400.0000 mg | ORAL_TABLET | Freq: Two times a day (BID) | ORAL | Status: DC

## 2011-03-03 MED ORDER — HYDROCODONE-APAP-DIETARY PROD 10-650 MG PO MISC: 1.0000 | Freq: Three times a day (TID) | ORAL | Status: DC

## 2011-03-03 MED ORDER — PHENTERMINE HCL 37.5 MG PO TABS: 37.5000 mg | ORAL_TABLET | Freq: Every day | ORAL | Status: DC

## 2011-03-09 NOTE — Progress Notes (Signed)
  Subjective:    Patient ID: [name], female    DOB: September 20, 1941, 70 y.o.   MRN: [id number] this 70 year old white married female who has passed his coronary artery disease in the care of her cardiologist Dr. Marcelle Overlie in half we'llnHPI  Patient's pain complaint today is that of painful hands secondary to arthritis of her fingers it severe enough that time she needs hydrocodone and will refill diclofenac She is also concerned about weight gain and we have discussed decreased calorie diet and prescribed phentermine for 3 months to help decrease her appetite Failure mentioned the patient had a stent in October 2000 lung Blood pressure is under good control Patient needs to come in for a complete physical evaluation as well as laboratory studies Had mammogram in February which was negative Continues to complain of chronic low back pain and needs hydrocodone      Review of SystemsC. History of present illness     Objective:   Physical Exam The patient is a well-developed  wellnourished somewhat obese white female who is alert cooperative and pleasant  Lungs are clear to palpation percussion and auscultation Heart examination reveals normal size heart no murmurs regular rhythm  Examination of the hand reveal arthritic joints   tender to palpation  no edema   Assessment & Plan:  Primary assessment is that the patient has rales severe osteoarthritis of the fingers and will resume diclofenac 75 mg b.i.d. Also a prescription for hydrocodone with acetaminophen for pain Discussed thousand calorie diet as well as increase in exercise and prescribing phentermine 37.5 mg q.d. For 3 months Refill acyclovir for her herpes simplex Continued chronic lumbosacral pain and it take diclofenac as above as well as hydrocodone when needed

## 2011-03-09 NOTE — Patient Instructions (Signed)
Return in 3 months for complete physical evaluation Technique axial 5 arthritis of fingers as well as the back, use hydrocodone acetaminophen p.r.n. For pain Acyclovir for herpes simplex

## 2011-04-10 NOTE — Discharge Summary (Signed)
Cynthia, Mcmillan                ACCOUNT NO.:  [id number]   MEDICAL RECORD NO.:  [id number]          PATIENT TYPE:  INP   LOCATION:  0445                         FACILITY:  [facility]   PHYSICIAN:  Madlyn Frankel. Charlann Boxer, M.D.  DATE OF BIRTH:  [birth year]   DATE OF ADMISSION:  08/20/2004  DATE OF DISCHARGE:  08/24/2004                                 DISCHARGE SUMMARY   ADMISSION DIAGNOSES:  1.  Infected olecranon bursa, right elbow.  2.  Fibromyalgia.  3.  Hypertension.  4.  History of cardiac arrhythmia.  5.  Reflux.  6.  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  7.  History of Staphylococcus infection secondary to foot surgery in the      past.   DISCHARGE DIAGNOSES:  1.  Infected olecranon bursa, right elbow.  2.  Fibromyalgia.  3.  Hypertension.  4.  History of cardiac arrhythmia.  5.  Reflux.  6.  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  7.  History of Staphylococcus infection secondary to foot surgery in the      past.   CONSULTS:  Dr. Lenn Sink of infectious diseases.   OPERATION:  On August 20, 2004, the patient underwent I&D with excision  of substandard tissue, right elbow.  Jenne Campus assisted.   BRIEF HISTORY:  This is 70 year old white female with progressive problems  concerning her right elbow.  She reports no trauma, but she had increasing  redness and swelling into her right elbow with drainage.  Conservative  measures failed to help this heal, so it was decided that she needed  surgical intervention and was admitted for the above procedure.   COURSE IN THE HOSPITAL:  The patient tolerated the surgical procedure quite  well.  She was seen directly after surgery by Dr. Ponciano Ort of infectious  diseases department [facility].  We followed her cultures very closely,  and she grew out Staphylococcus aureus which was sensitive to all but  penicillin.  We did put her on Unasyn postoperatively, then changed to  vancomycin.  It  was thought early on that she could have a MRSA infection  here.  Fortunately, this did not occur, and cultures showed sensitivity that  could be treated with Keflex.  It was decided that 7-10 days of Ancef or  Keflex would be appropriate, in Dr. Danella Penton expert opinion, and we  decided on Keflex 500 mg 1 p.o. b.i.d.  This was changed at discharge.   Serial dressing changes with Iodoform packing was done to the right elbow.  On the day of discharge, we examined the wound.  The packing was in place.  She had minimal to no erythema.  The wound looked like it was healing really  quite well.  She will need daily dressing changes.  We will arrange this  through home health [facility].  Once that is accomplished, then she can be  discharged home.   LABORATORY VALUES IN THE HOSPITAL:  Hemoglobin 13.9; hematocrit was 40.0.  BUN was 21; the creatinine was 0.6.  cultures from the right elbow showed a  few Gram stain positive cocci.  Cultures grew out moderate Staphylococcus  aureus sensitive to all but penicillin.   CONDITION ON DISCHARGE:  Improved, stable.   PLAN:  The patient discharged to her home in the care of her family.  She is  to have home health with Genevieve Norlander daily with dressing changes and repacking  with quarter-inch Iodoform gauze.  We will send her home with Keflex 1  b.i.d. for 7-10 days.   DISCHARGE MEDICATIONS:  1.  Darvocet-100 #30 with a refill, 1-2 q.4-6h. p.r.n. pain.  2.  Restoril 15 mg (at her request) #60, 1-2 h.s. p.r.n. sleep.   Return to see Dr. Charlann Boxer Friday.  Continue with her home medications and diet.  She is to use the plaster splint for protection over the Kerlix wrap over  the Iodoform gauze.  Keep the dressing clean and dry and call if any  problems.      DLU/MEDQ  D:  08/25/2004  T:  08/25/2004  Job:  [id number]

## 2011-04-10 NOTE — Op Note (Signed)
NAMEKENNISHA, Cynthia [name]                ACCOUNT NO.:  [id number]   MEDICAL RECORD NO.:  [id number]          PATIENT TYPE:  AMB   LOCATION:  DAY                          FACILITY:  [facility]   PHYSICIAN:  Madlyn Frankel. Charlann Boxer, M.D.  DATE OF BIRTH:  10/22/1941   DATE OF PROCEDURE:  08/20/2004  DATE OF DISCHARGE:                                 OPERATIVE REPORT   PREOPERATIVE DIAGNOSES:  Septic versus nonseptic right olecranon bursitis.   POSTOPERATIVE DIAGNOSES/FINDINGS:  Septic olecranon bursitis.   PROCEDURE:  I&D right elbow with debridement of skin, subcutaneous and  partial bone. Partial primary closure in addition to packing of wound with  iodoform gauze.   SURGEON:  Madlyn Frankel. Charlann Boxer, M.D.   ASSISTANT:  Cherly Beach, P.[name].-C.   ANESTHESIA:  General.   ESTIMATED BLOOD LOSS:  Minimal.   TOURNIQUET TIME:  23 minutes at 200 mmHg.   COMPLICATIONS:  None.   DRAINS:  None but packed with gauze.   INDICATIONS FOR PROCEDURE:  Cynthia [name] is [name] very pleasant 70 year old  female who was initially seen in the office on the 24th of September where  she was noted at that time to have [name] slightly erythematous but very swollen  olecranon bursa.  Initial management consisting of [name] compression wrap and  antiinflammatories. She presented to the office two days later with the  onset of drainage. Based on this, she was set up to go to the operating room  for drainage. At that point, it was not entirely clear whether or not it was  septic bursitis or not. She had not reported fevers, did not have cellulitis  or streaking in the arm.  I did review with her the treatment options of  septic versus nonseptic bursitis including the option of the wound being  remained opened and packing, IV antibiotics and hospital stay with dressing  changes at home. After reviewing all of this, she consented for the above  procedure.   DESCRIPTION OF PROCEDURE:  The patient was brought to the operative theatre.  Once  adequate anesthesia and preoperative antibiotics were administered, the  patient was positioned in the sloppy left lateral decubitus position. The  right upper extremity was placed in the proximal arm tourniquet. The right  upper extremity was then prepped and draped in sterile fashion using  Betadine scrub and paint. The patient's elbow was identified and found to  have very thin and nonviable appearing skin overlying this bursa with three  different puncture holes in the skin. Note that there had been no previous  drainage with the needle.  However, there were three holes in the skin.  [name]  fairly decent size 3 cm elliptical incision was made to carry out this and  debride the skin edges. Sharp dissection was carried down to the bursal area  which was noted to be very fibrinous and nonviable and not even very visible  as his true bursal sac. Debridement was carried out as visible using [employer], rongeur. Some of the debridement was carried out and followed down  to the triceps insertion on the  olecranon.  The portion of this had to be  debrided down to the bone.  Curettes were used to scrap the bone edges.  Following this adequate debridement, the edges appeared much more clear and  intact. The wound was then irrigated with 1 liter of normal saline with  bacitracin fluid. Cultures were taken and sent for evaluation and infectious  disease consult.  Following this debridement and irrigation, the wound was  partially closed at the proximal distal edges of the elliptical portion and  then packed with [name] significant amount of iodoform gauze.   Following this, the patient's wound was cleaned, dried and dressed with  dressing gauze, ABD pad and __________.  [name] splint was then applied to the  posterior aspect of the elbow to hold it into an extended position. The  patient was then awakened from anesthesia and transferred to the recovery  room in stable condition.   Postoperative plans  call for the patient to be admitted for at least 3-4  days with IV Unasyn.  Infectious disease will be consulted for  recommendation on long-term treatment especially with the exposed bone. Will  also have them evaluate for the cultures. She also will be seen by the case  manager and wound care nurse for daily sterile dressing changes.      MDO/MEDQ  D:  08/20/2004  T:  08/20/2004  Job:  [id number]

## 2011-04-10 NOTE — Discharge Summary (Signed)
[name], [name]                ACCOUNT NO.:  [id number]   MEDICAL RECORD NO.:  [id number]          PATIENT TYPE:  INP   LOCATION:  0445                         [facility]ACILITY:  [facility]   PHYSICIAN:  Madlyn Frankel. Charlann Boxer, M.D.  DATE OF BIRTH:  12-Sep-1941   DATE OF ADMISSION:  08/20/2004  DATE OF DISCHARGE:  08/25/2004                                 DISCHARGE SUMMARY   CHIEF COMPLAINT:  Pain to my right elbow.   HISTORY OF PRESENT ILLNESS:  This 70 year old lady was seen for continuing  problems concerning her right elbow.  She had been treated conservatively,  but unfortunately, she continued with drainage to the right elbow.  Examination revealed a marked amount of erythematous, edematous tissue,  which was in very poor quality.  She had drainage from multiple small  punctate ulcerations in the skin.  She was quite miserable with this  situation.  She had actually failed conservative care, and it was felt she  would benefit from surgical intervention.  An I&D of the right elbow was  planned.   PAST MEDICAL HISTORY:  Patient had a cholecystectomy about 30 years ago.  She has had bilateral foot surgeries.  One of her feet got infected with  Staphylococcus and almost lost my foot.  That resolved.  She has had two  angioplasties, in 1991 and 1994.  She currently has hypertension and mild  cardiac arrhythmia.  She also has reflux and fibromyalgia.  Dr. Garey Ham  at [facility] is her physician.  Dr. Marcelle Overlie at [facility] is her cardiologist.   MEDICATIONS:  1.  Cardia 240 mg 1 daily  2.  Atenolol 25 mg 1 daily  3.  Acyclovir 400 mg 1 daily  4.  Aspirin 81 mg daily (this was stopped prior to surgery).  5.  Vytorin 80/10 1 daily  6.  Nitro patch p.r.n.  She has not used it for a while.  7.  She used various eye drops secondary to a herpetic infection with loss      of vision in the right eye, one being prednisone.  [facility] are her  ophthalmologists.   FAMILY HISTORY:  Noncontributory.   SOCIAL HISTORY:  The patient currently is smoking a pack of cigarettes a day  for about 30 years.  Has tried to cut down and plans to stop after this  admission.  No intake of alcohol products.   REVIEW OF SYSTEMS:  CNS:  No seizures, other paralysis, numbness, double  vision; however, the patient does have about 90% blindness of the right eye  secondary to herpetic infection.  RESPIRATORY:  No productive cough.  No  hemoptysis.  No shortness of breath.  CARDIOVASCULAR:  No chest pain,  angina, or orthopnea.  GASTROINTESTINAL:  No nausea, vomiting.  Minimal  bloody stool.  She uses Maalox for her reflux.  GENITOURINARY:  No  discharge, dysuria, or hematuria.  MUSCULOSKELETAL:  Primarily in the  present illness but she also has fibromyalgia.   PHYSICAL EXAMINATION:  VITAL SIGNS:  Blood pressure 130/76,  pulse 76,  respirations 18, temperature 97.5.  GENERAL:  She is an alert, cooperative, friendly 70 year old female.  HEENT:  Normocephalic and atraumatic.  PERRLA.  She has erythema in the  sclera of the right eye.  Somewhat dull lens.  HEART:  Regular rate and rhythm.  No murmurs are heard.  ABDOMEN:  Soft and nontender.  The liver not felt.  GENITALIA:  Rectal, pelvic, breasts not done, not __________ .  MUSCULOSKELETAL:  Right elbow, as in present illness above.   ADMISSION DIAGNOSES:  1.  Infection, olecranon bursa, right elbow.  2.  Reflux.  3.  Hypertension.  4.  History of cardiac arrhythmia.  5.  History of cardiac angioplasty.  6.  Fibromyalgia.  7.  Reflux.  8.  Blindness, right eye, secondary to herpetic infection.   PLAN:  Patient is to undergo an I&D with cultures of her right elbow  olecranon bursa.      DLU/MEDQ  D:  08/25/2004  T:  08/25/2004  Job:  [id number]

## 2011-09-28 ENCOUNTER — Telehealth: Payer: Self-pay

## 2011-09-28 ENCOUNTER — Ambulatory Visit (INDEPENDENT_AMBULATORY_CARE_PROVIDER_SITE_OTHER): Payer: Medicare Other

## 2011-09-28 DIAGNOSIS — Z23 Encounter for immunization: Secondary | ICD-10-CM

## 2011-09-28 NOTE — Telephone Encounter (Signed)
error 

## 2011-10-14 ENCOUNTER — Other Ambulatory Visit: Payer: Self-pay | Admitting: Family Medicine

## 2011-11-25 ENCOUNTER — Telehealth: Payer: Self-pay | Admitting: Family Medicine

## 2011-11-25 ENCOUNTER — Ambulatory Visit (INDEPENDENT_AMBULATORY_CARE_PROVIDER_SITE_OTHER): Payer: Medicare Other | Admitting: Family Medicine

## 2011-11-25 ENCOUNTER — Encounter: Payer: Self-pay | Admitting: Family Medicine

## 2011-11-25 VITALS — BP 139/81 | HR 57 | Temp 97.0°F | Ht 63.0 in | Wt 193.4 lb

## 2011-11-25 DIAGNOSIS — K219 Gastro-esophageal reflux disease without esophagitis: Secondary | ICD-10-CM

## 2011-11-25 DIAGNOSIS — N8111 Cystocele, midline: Secondary | ICD-10-CM

## 2011-11-25 DIAGNOSIS — N390 Urinary tract infection, site not specified: Secondary | ICD-10-CM

## 2011-11-25 DIAGNOSIS — Z23 Encounter for immunization: Secondary | ICD-10-CM

## 2011-11-25 DIAGNOSIS — E7800 Pure hypercholesterolemia, unspecified: Secondary | ICD-10-CM

## 2011-11-25 DIAGNOSIS — M1990 Unspecified osteoarthritis, unspecified site: Secondary | ICD-10-CM | POA: Insufficient documentation

## 2011-11-25 DIAGNOSIS — N8110 Cystocele, unspecified: Secondary | ICD-10-CM

## 2011-11-25 DIAGNOSIS — H6120 Impacted cerumen, unspecified ear: Secondary | ICD-10-CM | POA: Insufficient documentation

## 2011-11-25 DIAGNOSIS — D1779 Benign lipomatous neoplasm of other sites: Secondary | ICD-10-CM

## 2011-11-25 DIAGNOSIS — E785 Hyperlipidemia, unspecified: Secondary | ICD-10-CM

## 2011-11-25 DIAGNOSIS — M129 Arthropathy, unspecified: Secondary | ICD-10-CM

## 2011-11-25 DIAGNOSIS — I2510 Atherosclerotic heart disease of native coronary artery without angina pectoris: Secondary | ICD-10-CM

## 2011-11-25 DIAGNOSIS — D1720 Benign lipomatous neoplasm of skin and subcutaneous tissue of unspecified limb: Secondary | ICD-10-CM | POA: Insufficient documentation

## 2011-11-25 DIAGNOSIS — R32 Unspecified urinary incontinence: Secondary | ICD-10-CM

## 2011-11-25 DIAGNOSIS — I10 Essential (primary) hypertension: Secondary | ICD-10-CM

## 2011-11-25 HISTORY — DX: Unspecified osteoarthritis, unspecified site: M19.90

## 2011-11-25 LAB — RENAL FUNCTION PANEL
BUN: 18 mg/dL (ref 6–23)
Creatinine, Ser: 0.5 mg/dL (ref 0.4–1.2)
GFR: 121.76 mL/min (ref >60.00)
Glucose, Bld: 109 mg/dL — ABNORMAL HIGH (ref 70–99)
Sodium: 141 mEq/L (ref 135–145)

## 2011-11-25 LAB — LIPID PANEL
Cholesterol: 257 mg/dL — ABNORMAL HIGH (ref 0–200)
Total CHOL/HDL Ratio: 7
Triglycerides: 140 mg/dL (ref 0.0–149.0)
VLDL: 28 mg/dL (ref 0.0–40.0)

## 2011-11-25 LAB — CBC
MCV: 93.4 fl (ref 78.0–100.0)
Platelets: 267 10*3/uL (ref 150.0–400.0)
RBC: 4.14 Mil/uL (ref 3.87–5.11)
WBC: 7.4 10*3/uL (ref 4.5–10.5)

## 2011-11-25 LAB — TSH: TSH: 0.54 u[IU]/mL (ref 0.35–5.50)

## 2011-11-25 LAB — HEPATIC FUNCTION PANEL
ALT: 17 U/L (ref 0–35)
Albumin: 4.1 g/dL (ref 3.5–5.2)
Total Bilirubin: 0.3 mg/dL (ref 0.3–1.2)

## 2011-11-25 MED ORDER — ATENOLOL 50 MG PO TABS: 50.0000 mg | ORAL_TABLET | Freq: Every day | ORAL | Status: DC

## 2011-11-25 MED ORDER — SIMVASTATIN 40 MG PO TABS: 40.0000 mg | ORAL_TABLET | Freq: Every day | ORAL | Status: DC

## 2011-11-25 MED ORDER — ACETIC ACID 2 % OT SOLN: OTIC | Status: DC

## 2011-11-25 MED ORDER — TRAMADOL HCL 50 MG PO TABS: 50.0000 mg | ORAL_TABLET | Freq: Three times a day (TID) | ORAL | Status: AC | PRN

## 2011-11-25 MED ORDER — DILTIAZEM HCL ER COATED BEADS 240 MG PO CP24: 240.0000 mg | ORAL_CAPSULE | Freq: Every day | ORAL | Status: DC

## 2011-11-25 NOTE — Progress Notes (Signed)
Patient ID: [name], female   DOB: Jul 11, 1941, 71 y.o.   MRN: [id number] [name] [id number] Mar 24, 1941 11/25/2011      Progress Note New Patient  Subjective  Chief Complaint  Chief Complaint  Patient presents with  . Establish Care    new patient    HPI  Patient is a 71 year old Caucasian female who is in today for new patient appointment. Her previous PMD Dr. Scotty Court is retired. She denies any recent illness per se but does have some concerns today. She notes worsening urinary incontinence for 4-5 months. Urinary urgency is noted. She does feel as if she palpates something when she wipes after urinating. Please her bladder is falling and affected her bowels as well. She has frequent small formed bowel movements daily or as historically she's had some trouble constipation. She denies any dysuria, hematuria, fevers, chills, chest pain, palpitations, shortness of breath, GI or GU complaints otherwise. She reports being on atenolol and Cardizem to control irregular and rapid heartbeat in the past and says she may have been told she had A. fib previously. At present it is well controlled and she needs refills. She tolerates her meds. Her other complaints or musculoskeletal. She has a small lesion under her right upper arm has been present for quite some time does become somewhat uncomfortable. She's been told in the past it was a benign fibroma versus lipoma. Also complains of arthritic pain and locking up of her hands in the morning most notably the right. She also has a new nodule on her left fourth finger which is tender to touch. She also notes that in the morning just wakes up feeling rigors or plugged and she believes she has cerumen impaction. Has some waxy discharge at times. No headaches, vision or hearing changes. No other acute complaints noted today.  Past Medical History  Diagnosis Date  . ANEMIA 08/23/2008  . BACK PAIN, LUMBAR, CHRONIC 02/18/2010  . CORONARY ARTERY DISEASE  08/23/2008  . GERD 08/23/2008  . HYPERLIPIDEMIA 08/23/2008  . HYPOTHYROIDISM 08/23/2008  . OSTEOPENIA 08/23/2008  . Arthritis 11/25/2011  . Cerumen impaction 11/25/2011    Past Surgical History  Procedure Date  . Angioplasty 1991 & 1996  . Coronary stent placement 08/2010    Family History  Problem Relation Age of Onset  . Dementia Mother   . Arthritis Mother   . Heart disease Father   . Heart attack Father   . Hyperlipidemia Sister   . Hyperlipidemia Brother   . Cancer Maternal Aunt     breast    History   Social History  . Marital Status: Married    Spouse Name: N/A    Number of Children: N/A  . Years of Education: N/A   Occupational History  . Not on file.   Social History Main Topics  . Smoking status: Former Smoker    Quit date: 03/02/2009  . Smokeless tobacco: Not on file  . Alcohol Use: Yes  . Drug Use: No  . Sexually Active: Not on file   Other Topics Concern  . Not on file   Social History Narrative  . No narrative on file    Current Outpatient Prescriptions on File Prior to Visit  Medication Sig Dispense Refill  . aspirin 81 MG tablet Take 81 mg by mouth daily.        . ergocalciferol (VITAMIN D2) 50000 UNITS capsule Take 50,000 Units by mouth once a week.        . famotidine (  PEPCID) 20 MG tablet       . Hydrocodone-APAP-Dietary Prod (HYDROCODONE-APAP-NUTRIT SUPP) 10-650 MG MISC Take 1 tablet by mouth 3 (three) times daily after meals. Change sig to 1 q4h prn pain not over 3 times per day  100 each  5    No Known Allergies  Review of Systems  Review of Systems  Constitutional: Negative for fever and malaise/fatigue.  HENT: Positive for hearing loss. Negative for ear pain and congestion.        Notes waxy discharge at times and hearing fluctuates  Eyes: Negative for discharge.  Respiratory: Negative for cough and shortness of breath.   Cardiovascular: Negative for chest pain, palpitations and leg swelling.  Gastrointestinal: Positive for  constipation. Negative for heartburn, nausea, abdominal pain, diarrhea, blood in stool and melena.       Small, frequent but formed BMs daily. Notes h/o constipation in past  Genitourinary: Positive for urgency. Negative for dysuria, hematuria and flank pain.       Incontinence  Musculoskeletal: Negative for falls.  Skin: Negative for rash.  Neurological: Negative for loss of consciousness and headaches.  Endo/Heme/Allergies: Negative for environmental allergies and polydipsia.  Psychiatric/Behavioral: Negative for depression and suicidal ideas. The patient is not nervous/anxious and does not have insomnia.     Objective  BP 139/81  Pulse 57  Temp(Src) 97 F (36.1 C) (Temporal)  Ht 5\' 3"  (1.6 m)  Wt 193 lb 6.4 oz (87.726 kg)  BMI 34.26 kg/m2  SpO2 97%  LMP 11/23/1993  Physical Exam  Physical Exam  Constitutional: She is oriented to person, place, and time and well-developed, well-nourished, and in no distress. No distress.  HENT:  Head: Normocephalic and atraumatic.  Right Ear: External ear normal.  Left Ear: External ear normal.  Nose: Nose normal.  Mouth/Throat: Oropharynx is clear and moist. No oropharyngeal exudate.  Eyes: Conjunctivae are normal. Pupils are equal, round, and reactive to light. Right eye exhibits no discharge. Left eye exhibits no discharge. No scleral icterus.  Neck: Normal range of motion. Neck supple. No thyromegaly present.  Cardiovascular: Normal rate, regular rhythm, normal heart sounds and intact distal pulses.   No murmur heard. Pulmonary/Chest: Effort normal and breath sounds normal. No respiratory distress. She has no wheezes. She has no rales.  Abdominal: Soft. Bowel sounds are normal. She exhibits no distension and no mass. There is no tenderness.  Musculoskeletal: Normal range of motion. She exhibits no edema and no tenderness.       2-3 cm firm, nodule on upper right arm. Nontender, mobile  Lymphadenopathy:    She has no cervical  adenopathy.  Neurological: She is alert and oriented to person, place, and time. She has normal reflexes. No cranial nerve deficit. Coordination normal.  Skin: Skin is warm and dry. No rash noted. She is not diaphoretic.  Psychiatric: Mood, memory and affect normal.       Assessment & Plan  Arthritis Complaining of multiple arthritic pains including stiffness and locking in the right hand. She is to additionally use diclofenac with minimal effect in the past. Is now also complaining of a nodule on her fourth left finger it is tender to touch and firm. Appears to be an arthritic nodule. Is encouraged to try applying Aspercreme to the nodule. Is asked to use 2 ibuprofen in the morning for the traditional arthritis and 2 Tylenol at bedtime. We will discontinue diclofenac due to possible side effects. Encouraged to start a diuretic daily and we will discuss  more fully at next visit. Is given Tramadol to try for severe pain  UTI Patient noting 4 or 5 months now increased urinary incontinence. Also notes some intermittent trouble with increased stooling and difficulty passing her stool. Moving more frequent stool and even noting a sensation of palpitations and when she wipes after urination. Suspect bladder and possible uterine prolapse given his history. Urine is sent for culture today she is only able to give [place] a small amount. She is new to our practice today but we will refer her to GYN for her ongoing GYN care and possible evaluation of uterine and bladder all appear  GERD Reports having a colonoscopy she thinks in the last 10 years but cannot recall the date in our system reports last colonoscopy in 1994. She will check with their office and we will request records. She Dermatology for her is and Wednesday and she is made aware that it has been with continued she will need a colonoscopy to further evaluate her change in bowels.  Cerumen impaction Patient encouraged to get a wax removal kit OTC  and apply drops roughly 10 drops each ear at bedtime x 3 days and then come back in for cerumen removal visit at her discretion  HYPERLIPIDEMIA Simvastatin was still at 80 mg. This is Dr. 40 mg today we will check a lipid panel as it has not been checked since March of 2011. She is to avoid transplant and switch from fish oil to  Megared when her current supply runs out, we will reevaluate when labs are available.  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 She is offered referral for possible excision but will hold off for now and we will rediscuss at her next visit.  CORONARY ARTERY DISEASE Denies history of essential hypertension but is on Cardizem and Tenormin she reports more for irregular and fast heart beat. She reports having been told in the past she does suffer from atrial fibrillation at times. She is in regular rhythm today. She is given refills of Tenormin and Cardizem today. Of note her blood pressure is mildly elevated today but it is her first visit we will reassess at next visit

## 2011-11-25 NOTE — Telephone Encounter (Signed)
Patient needs ear drops sent to the pharmacy, also patient will come in later to give a urine specimen, she couldn't urinate when she was here for her OV

## 2011-11-25 NOTE — Assessment & Plan Note (Signed)
Vs adenoma, patient reports it has been present for quite some time but is becoming mildly uncomfortable. She is offered referral for possible excision but will hold off for now and we will rediscuss at her next visit.

## 2011-11-25 NOTE — Patient Instructions (Signed)

## 2011-11-25 NOTE — Assessment & Plan Note (Signed)
Denies history of essential hypertension but is on Cardizem and Tenormin she reports more for irregular and fast heart beat. She reports having been told in the past she does suffer from atrial fibrillation at times. She is in regular rhythm today. She is given refills of Tenormin and Cardizem today. Of note her blood pressure is mildly elevated today but it is her first visit we will reassess at next visit

## 2011-11-25 NOTE — Progress Notes (Signed)
Addended by: Baldemar Lenis R on: 11/25/2011 03:27 PM   Modules accepted: Orders

## 2011-11-25 NOTE — Assessment & Plan Note (Signed)
Reports having a colonoscopy she thinks in the last 10 years but cannot recall the date in our system reports last colonoscopy in 1994. She will check with their office and we will request records. She Dermatology for her is and Wednesday and she is made aware that it has been with continued she will need a colonoscopy to further evaluate her change in bowels.

## 2011-11-25 NOTE — Assessment & Plan Note (Addendum)
Complaining of multiple arthritic pains including stiffness and locking in the right hand. She is to additionally use diclofenac with minimal effect in the past. Is now also complaining of a nodule on her fourth left finger it is tender to touch and firm. Appears to be an arthritic nodule. Is encouraged to try applying Aspercreme to the nodule. Is asked to use 2 ibuprofen in the morning for the traditional arthritis and 2 Tylenol at bedtime. We will discontinue diclofenac due to possible side effects. Encouraged to start a diuretic daily and we will discuss more fully at next visit. Is given Tramadol to try for severe pain

## 2011-11-25 NOTE — Telephone Encounter (Signed)
Please advise 

## 2011-11-25 NOTE — Assessment & Plan Note (Signed)
Patient encouraged to get a wax removal kit OTC and apply drops roughly 10 drops each ear at bedtime x 3 days and then come back in for cerumen removal visit at her discretion

## 2011-11-25 NOTE — Assessment & Plan Note (Signed)
Patient noting 4 or 5 months now increased urinary incontinence. Also notes some intermittent trouble with increased stooling and difficulty passing her stool. Moving more frequent stool and even noting a sensation of palpitations and when she wipes after urination. Suspect bladder and possible uterine prolapse given his history. Urine is sent for culture today she is only able to give [place] a small amount. She is new to our practice today but we will refer her to GYN for her ongoing GYN care and possible evaluation of uterine and bladder all appear

## 2011-11-25 NOTE — Assessment & Plan Note (Signed)
Simvastatin was still at 80 mg. This is Dr. 40 mg today we will check a lipid panel as it has not been checked since March of 2011. She is to avoid transplant and switch from fish oil to  Megared when her current supply runs out, we will reevaluate when labs are available.

## 2011-11-25 NOTE — Telephone Encounter (Signed)
vosol ear drops 5-10 drops b/l ears qhs x 3-5 days and then come in to have ears flush ears, disp # 1 bottle

## 2011-11-27 LAB — URINE CULTURE

## 2011-11-30 ENCOUNTER — Ambulatory Visit: Payer: Medicare Other

## 2011-12-01 ENCOUNTER — Ambulatory Visit (INDEPENDENT_AMBULATORY_CARE_PROVIDER_SITE_OTHER): Payer: Medicare Other | Admitting: Family Medicine

## 2011-12-01 DIAGNOSIS — R350 Frequency of micturition: Secondary | ICD-10-CM

## 2011-12-01 DIAGNOSIS — H6120 Impacted cerumen, unspecified ear: Secondary | ICD-10-CM

## 2011-12-01 LAB — POCT URINALYSIS DIPSTICK
Blood, UA: NEGATIVE
Protein, UA: NEGATIVE
Spec Grav, UA: 1.02
Urobilinogen, UA: 0.2

## 2011-12-01 MED ORDER — CIPROFLOXACIN-DEXAMETHASONE 0.3-0.1 % OT SUSP: 4.0000 [drp] | Freq: Two times a day (BID) | OTIC | Status: DC

## 2011-12-22 NOTE — Progress Notes (Signed)
Patient ID: [name], female   DOB: 01-08-1941, 71 y.o.   MRN: [id number] Patient in for Cerumen Disimpaction with nursing. Complaining of some discomfort in your ears. Tolerated the disimpaction well. And reported being able to hear much better after the disimpaction was completed. Patient denies headache, fevers or other signs of illness.  Physical exam:  Well-nourished well-developed Caucasian female in no acute distress HEENT: Bilateral external canals are erythematous right is worse than left. Scan in right canal is swollen ear dictated and broken down. No posterior regular lymphadenopathy noted bilaterally.  Assessment and plan:  Otitis externa: Patient placed on Ciprodex eardrops 2-3 times daily for the next 7-10 days and to call if symptoms worsen or any concerns.

## 2011-12-22 NOTE — Progress Notes (Signed)
Charting is done

## 2012-01-15 ENCOUNTER — Other Ambulatory Visit: Payer: Self-pay | Admitting: Family Medicine

## 2012-01-15 DIAGNOSIS — Z1231 Encounter for screening mammogram for malignant neoplasm of breast: Secondary | ICD-10-CM

## 2012-02-05 ENCOUNTER — Ambulatory Visit
Admission: RE | Admit: 2012-02-05 | Discharge: 2012-02-05 | Disposition: A | Discharge status: Home / Self Care | Payer: Medicare Other | Source: Ambulatory Visit | Attending: Family Medicine | Admitting: Family Medicine

## 2012-02-05 DIAGNOSIS — Z1231 Encounter for screening mammogram for malignant neoplasm of breast: Secondary | ICD-10-CM

## 2012-03-04 ENCOUNTER — Other Ambulatory Visit: Payer: Self-pay | Admitting: Family Medicine

## 2012-03-06 NOTE — Telephone Encounter (Signed)
She can have a refill with same sig and same number but ponly 2 rf

## 2012-04-12 ENCOUNTER — Other Ambulatory Visit: Payer: Self-pay | Admitting: Family Medicine

## 2012-08-05 ENCOUNTER — Encounter: Payer: Self-pay | Admitting: Family Medicine

## 2012-08-05 ENCOUNTER — Ambulatory Visit (INDEPENDENT_AMBULATORY_CARE_PROVIDER_SITE_OTHER): Payer: Medicare Other | Admitting: Family Medicine

## 2012-08-05 VITALS — BP 140/82 | HR 62 | Temp 97.8°F | Ht 63.0 in | Wt 191.4 lb

## 2012-08-05 DIAGNOSIS — N951 Menopausal and female climacteric states: Secondary | ICD-10-CM

## 2012-08-05 DIAGNOSIS — Z0000 Encounter for general adult medical examination without abnormal findings: Secondary | ICD-10-CM

## 2012-08-05 DIAGNOSIS — I10 Essential (primary) hypertension: Secondary | ICD-10-CM

## 2012-08-05 DIAGNOSIS — M545 Low back pain, unspecified: Secondary | ICD-10-CM

## 2012-08-05 DIAGNOSIS — E669 Obesity, unspecified: Secondary | ICD-10-CM

## 2012-08-05 DIAGNOSIS — R739 Hyperglycemia, unspecified: Secondary | ICD-10-CM

## 2012-08-05 DIAGNOSIS — E7800 Pure hypercholesterolemia, unspecified: Secondary | ICD-10-CM

## 2012-08-05 DIAGNOSIS — M1990 Unspecified osteoarthritis, unspecified site: Secondary | ICD-10-CM

## 2012-08-05 DIAGNOSIS — E039 Hypothyroidism, unspecified: Secondary | ICD-10-CM

## 2012-08-05 DIAGNOSIS — Z23 Encounter for immunization: Secondary | ICD-10-CM

## 2012-08-05 DIAGNOSIS — D649 Anemia, unspecified: Secondary | ICD-10-CM

## 2012-08-05 DIAGNOSIS — E785 Hyperlipidemia, unspecified: Secondary | ICD-10-CM

## 2012-08-05 DIAGNOSIS — R7309 Other abnormal glucose: Secondary | ICD-10-CM

## 2012-08-05 DIAGNOSIS — H6120 Impacted cerumen, unspecified ear: Secondary | ICD-10-CM

## 2012-08-05 DIAGNOSIS — I2510 Atherosclerotic heart disease of native coronary artery without angina pectoris: Secondary | ICD-10-CM

## 2012-08-05 DIAGNOSIS — IMO0001 Reserved for inherently not codable concepts without codable children: Secondary | ICD-10-CM

## 2012-08-05 LAB — RENAL FUNCTION PANEL
Albumin: 3.9 g/dL (ref 3.5–5.2)
CO2: 27 mEq/L (ref 19–32)
Calcium: 9.3 mg/dL (ref 8.4–10.5)
Creatinine, Ser: 0.5 mg/dL (ref 0.4–1.2)
Glucose, Bld: 99 mg/dL (ref 70–99)
Potassium: 4.2 mEq/L (ref 3.5–5.1)

## 2012-08-05 LAB — CBC
HCT: 37.3 % (ref 36.0–46.0)
MCV: 94.1 fl (ref 78.0–100.0)
Platelets: 240 10*3/uL (ref 150.0–400.0)
RBC: 3.96 Mil/uL (ref 3.87–5.11)
WBC: 5.9 10*3/uL (ref 4.5–10.5)

## 2012-08-05 LAB — HEPATIC FUNCTION PANEL
ALT: 23 U/L (ref 0–35)
AST: 23 U/L (ref 0–37)
Bilirubin, Direct: 0.1 mg/dL (ref 0.0–0.3)
Total Bilirubin: 0.5 mg/dL (ref 0.3–1.2)
Total Protein: 7.1 g/dL (ref 6.0–8.3)

## 2012-08-05 LAB — LIPID PANEL
Cholesterol: 234 mg/dL — ABNORMAL HIGH (ref 0–200)
Total CHOL/HDL Ratio: 9
VLDL: 28.4 mg/dL (ref 0.0–40.0)

## 2012-08-05 LAB — LDL CHOLESTEROL, DIRECT: Direct LDL: 183.1 mg/dL

## 2012-08-05 MED ORDER — FAMOTIDINE 20 MG PO TABS: 20.0000 mg | ORAL_TABLET | Freq: Two times a day (BID) | ORAL | Status: DC | PRN

## 2012-08-05 MED ORDER — CONJ ESTROG-MEDROXYPROGEST ACE 0.3-1.5 MG PO TABS: 1.0000 | ORAL_TABLET | Freq: Every day | ORAL | Status: DC

## 2012-08-05 MED ORDER — DILTIAZEM HCL ER COATED BEADS 240 MG PO CP24: 240.0000 mg | ORAL_CAPSULE | Freq: Every day | ORAL | Status: DC

## 2012-08-05 MED ORDER — PRAVASTATIN SODIUM 40 MG PO TABS: 40.0000 mg | ORAL_TABLET | Freq: Every day | ORAL | Status: DC

## 2012-08-05 MED ORDER — ALIGN PO CAPS: 1.0000 | ORAL_CAPSULE | Freq: Every day | ORAL | Status: DC

## 2012-08-05 MED ORDER — ATENOLOL 50 MG PO TABS: 50.0000 mg | ORAL_TABLET | Freq: Every day | ORAL | Status: DC

## 2012-08-05 MED ORDER — SIMVASTATIN 40 MG PO TABS: 40.0000 mg | ORAL_TABLET | Freq: Every day | ORAL | Status: DC

## 2012-08-05 MED ORDER — HYDROCODONE-APAP-DIETARY PROD 10-650 MG PO MISC: 1.0000 | Freq: Three times a day (TID) | ORAL | Status: DC

## 2012-08-05 MED ORDER — NAPROXEN 500 MG PO TABS: 500.0000 mg | ORAL_TABLET | Freq: Two times a day (BID) | ORAL | Status: AC

## 2012-08-05 MED ORDER — KRILL OIL PO CAPS: 1.0000 | ORAL_CAPSULE | ORAL | Status: DC

## 2012-08-05 NOTE — Assessment & Plan Note (Signed)
Encouraged DASH/heart healthy diet

## 2012-08-05 NOTE — Patient Instructions (Addendum)

## 2012-08-05 NOTE — Assessment & Plan Note (Signed)
Has had 2 stents in past, asymptomatic at present but does follow with cardiology, Dr Marcelle Overlie

## 2012-08-05 NOTE — Assessment & Plan Note (Signed)
Will start low dose Prempro today at patient request and she is educated on need not to skip meals, take in excessive carbs etc.

## 2012-08-05 NOTE — Assessment & Plan Note (Signed)
Check tsh today 

## 2012-08-05 NOTE — Assessment & Plan Note (Signed)
Patient requesting Phentermine, encouraged to wait and try DASH diet then we will re check bp on  Other meds and decide if we will want meds at next visit

## 2012-08-05 NOTE — Assessment & Plan Note (Signed)
Sugars mildly elevated at last visit, will check hgba1c today, minimize simple carbs

## 2012-08-05 NOTE — Assessment & Plan Note (Signed)
Repeat cbc today 

## 2012-08-05 NOTE — Assessment & Plan Note (Signed)
Check lipids today. Avoid trans fats switched from Simvastatin to Pravastatin due to concerns regarding interactions with Diltiazem. Recheck lipids in 6-7 months. Switch from fish oil to [employer]

## 2012-08-05 NOTE — Progress Notes (Signed)
Patient ID: [name], female   DOB: 1941-06-30, 71 y.o.   MRN: [id number] [name] [id number] 1941-01-07 08/05/2012      Progress Note New Patient  Subjective  Chief Complaint  Chief Complaint  Patient presents with  . Annual Exam    physical and labs    HPI  Patient is a 71 year old Caucasian female who is in today for annual exam. She lives with her reports she feels well. He reports he recently had a colonoscopy and was told well. She believes that benign polyps but she was told to repeat the colonoscopy in 10 years time. She denies any GI complaints today. Bowels are moving better although she does note some recent increased gaseousness. Heartburn is tolerable at this time. He continues to struggle with chronic pain. Has a low back pain he describes some weakness and tremors in her upper legs at times. It occurs and laterally and does not happen often. No numbness tingling weakness or pain into her feet. No recent falls or trauma. She also has trouble with a lot of joints including her fingers which are often stiff and painful. No chest pain, palpitations, recent illness, GI or GU complaints otherwise noted today  Past Medical History  Diagnosis Date  . ANEMIA 08/23/2008  . BACK PAIN, LUMBAR, CHRONIC 02/18/2010  . CORONARY ARTERY DISEASE 08/23/2008  . GERD 08/23/2008  . HYPERLIPIDEMIA 08/23/2008  . HYPOTHYROIDISM 08/23/2008  . OSTEOPENIA 08/23/2008  . Arthritis 11/25/2011  . Cerumen impaction 11/25/2011  . Preventative health care 08/05/2012    [facility] [place], had a recent colonoscopy she reports being told she did not need another test for 10 years Sees dermatologist, Dr Octavia Heir  . Hyperglycemia 08/05/2012    Past Surgical History  Procedure Date  . Angioplasty 1991 & 1996  . Coronary stent placement 08/2010    Family History  Problem Relation Age of Onset  . Dementia Mother   . Arthritis Mother   . Heart disease Father   . Heart  attack Father   . Hyperlipidemia Sister   . Hyperlipidemia Brother   . Cancer Maternal Aunt     breast    History   Social History  . Marital Status: Married    Spouse Name: N/A    Number of Children: N/A  . Years of Education: N/A   Occupational History  . Not on file.   Social History Main Topics  . Smoking status: Former Smoker    Quit date: 03/02/2009  . Smokeless tobacco: Not on file  . Alcohol Use: Yes  . Drug Use: No  . Sexually Active: Not on file   Other Topics Concern  . Not on file   Social History Narrative  . No narrative on file    Current Outpatient Prescriptions on File Prior to Visit  Medication Sig Dispense Refill  . acyclovir (ZOVIRAX) 400 MG tablet TAKE 1 TABLET (400 MG TOTAL) BY MOUTH 2 (TWO) TIMES DAILY.  60 tablet  5  . aspirin 81 MG tablet Take 81 mg by mouth daily.        . diclofenac (VOLTAREN) 75 MG EC tablet TAKE ONE TABLET BY MOUTH 2 TIMES DAILY AFTER MEALS  60 tablet  2  . fish oil-omega-3 fatty acids 1000 MG capsule Take 2 g by mouth daily.        Marland Kitchen DISCONTD: atenolol (TENORMIN) 50 MG tablet Take 1 tablet (50 mg total) by mouth daily.  90  tablet  3  . DISCONTD: diltiazem (CARDIZEM CD) 240 MG 24 hr capsule Take 1 capsule (240 mg total) by mouth daily.  90 capsule  3  . ciprofloxacin-dexamethasone (CIPRODEX) otic suspension Place 4 drops into both ears 2 (two) times daily. X 7 days  7.5 mL  1  . ergocalciferol (VITAMIN D2) 50000 UNITS capsule Take 50,000 Units by mouth once a week.        . estrogen, conjugated,-medroxyprogesterone (PREMPRO) 0.3-1.5 MG per tablet Take 1 tablet by mouth daily.  30 tablet  5  . pravastatin (PRAVACHOL) 40 MG tablet Take 1 tablet (40 mg total) by mouth daily.  90 tablet  1  . DISCONTD: famotidine (PEPCID) 20 MG tablet         No Known Allergies  Review of Systems  Review of Systems  Constitutional: Positive for malaise/fatigue. Negative for fever and chills.  HENT: Negative for congestion.   Eyes:  Negative for discharge.  Respiratory: Negative for shortness of breath.   Cardiovascular: Negative for chest pain, palpitations and leg swelling.  Gastrointestinal: Negative for nausea, abdominal pain and diarrhea.  Genitourinary: Negative for dysuria.  Musculoskeletal: Positive for back pain and joint pain. Negative for falls.  Skin: Negative for rash.  Neurological: Negative for loss of consciousness and headaches.  Endo/Heme/Allergies: Negative for polydipsia.  Psychiatric/Behavioral: Negative for depression and suicidal ideas. The patient is not nervous/anxious and does not have insomnia.     Objective  BP 140/82  Pulse 62  Temp 97.8 F (36.6 C) (Temporal)  Ht 5\' 3"  (1.6 m)  Wt 191 lb 6.4 oz (86.818 kg)  BMI 33.90 kg/m2  SpO2 97%  LMP 11/23/1993  Physical Exam  Physical Exam  Constitutional: She is oriented to person, place, and time and well-developed, well-nourished, and in no distress. No distress.  HENT:  Head: Normocephalic and atraumatic.  Eyes: Conjunctivae normal are normal.  Neck: Neck supple. No thyromegaly present.  Cardiovascular: Normal rate and regular rhythm.   Murmur heard.      2/6 sys M  Pulmonary/Chest: Effort normal and breath sounds normal. She has no wheezes.  Abdominal: She exhibits no distension and no mass.  Musculoskeletal: She exhibits no edema.  Lymphadenopathy:    She has no cervical adenopathy.  Neurological: She is alert and oriented to person, place, and time.  Skin: Skin is warm and dry. No rash noted. She is not diaphoretic.  Psychiatric: Memory, affect and judgment normal.       Assessment & Plan  HYPERLIPIDEMIA Check lipids today. Avoid trans fats switched from Simvastatin to Pravastatin due to concerns regarding interactions with Diltiazem. Recheck lipids in 6-7 months. Switch from fish oil to [employer] caps  ANEMIA Repeat cbc today  Arthritis Has been taking Diclofenac for years, will try switching from this to  Naproxen and call if no improvement, add krill oil caps  BACK PAIN, LUMBAR, CHRONIC Given Hydrocodone to use sparingly for severe pain  CORONARY ARTERY DISEASE Has had 2 stents in past, asymptomatic at present but does follow with cardiology, Dr Marcelle Overlie  Cerumen impaction resolved  HYPOTHYROIDISM Check tsh today  Preventative health care Encouraged DASH/heart healthy diet  MENOPAUSE-RELATED VASOMOTOR SYMPTOMS, HOT FLASHES Will start low dose Prempro today at patient request and she is educated on need not to skip meals, take in excessive carbs etc.  EXOGENOUS OBESITY Patient requesting Phentermine, encouraged to wait and try DASH diet then we will re check bp on  Other meds and decide if we  will want meds at next visit  Hyperglycemia Sugars mildly elevated at last visit, will check hgba1c today, minimize simple carbs

## 2012-08-05 NOTE — Assessment & Plan Note (Signed)
Has been taking Diclofenac for years, will try switching from this to Naproxen and call if no improvement, add krill oil caps

## 2012-08-05 NOTE — Assessment & Plan Note (Signed)
resolved 

## 2012-08-05 NOTE — Assessment & Plan Note (Signed)
Given Hydrocodone to use sparingly for severe pain

## 2012-08-29 ENCOUNTER — Telehealth: Payer: Self-pay | Admitting: Family Medicine

## 2012-08-29 MED ORDER — PHENTERMINE HCL 15 MG PO CAPS: 15.0000 mg | ORAL_CAPSULE | ORAL | Status: DC

## 2012-08-29 MED ORDER — NORETHINDRONE-ETH ESTRADIOL 0.5-2.5 MG-MCG PO TABS: 1.0000 | ORAL_TABLET | Freq: Every day | ORAL | Status: DC

## 2012-08-29 NOTE — Telephone Encounter (Signed)
Please advise 

## 2012-08-29 NOTE — Telephone Encounter (Signed)
Her bp was borderlin so I would only be willing to give her the Phentermine 15 mg po daily for the first month and then she can come in and let me check her bp on the lower dose before we go back up. Can try FemHRT 1 tab daily (the low dose in the computer comes right up) disp 30 day supply with 2 refills, cancel prempro. If this is still too pricey she should check with her insurance company or have her pharmacist check her insurance and see what might be cheaper on her plan

## 2012-08-29 NOTE — Telephone Encounter (Signed)
Patient SW with pharmacy, the estradol patch or pill is not expensive. Can she get an Rx?

## 2012-08-29 NOTE — Telephone Encounter (Signed)
Left a detailed message on patients answering machine. RX's sent to pharmacy

## 2012-08-29 NOTE — Telephone Encounter (Signed)
Please advise? Does pt need appt for Phentermine?

## 2012-08-30 NOTE — Telephone Encounter (Signed)
Patient has been scheduled to come in for OV 09/21/12, will pu Rx

## 2012-08-31 NOTE — Telephone Encounter (Signed)
Left a detailed message on patients voicemail.

## 2012-08-31 NOTE — Telephone Encounter (Signed)
So I am sorry but she simply cannot have just estrogen, if she wants to see if she can get separate prescriptions for Progesterone and estrogen then I can refer her to GYN for consideration or I can prescribe the [facility]

## 2012-08-31 NOTE — Telephone Encounter (Signed)
Please send in Rx for estradol

## 2012-09-21 ENCOUNTER — Encounter: Payer: Self-pay | Admitting: Family Medicine

## 2012-09-21 ENCOUNTER — Ambulatory Visit (INDEPENDENT_AMBULATORY_CARE_PROVIDER_SITE_OTHER): Payer: Medicare Other | Admitting: Family Medicine

## 2012-09-21 VITALS — BP 178/90 | HR 69 | Temp 98.4°F | Ht 63.0 in | Wt 188.8 lb

## 2012-09-21 DIAGNOSIS — E785 Hyperlipidemia, unspecified: Secondary | ICD-10-CM

## 2012-09-21 DIAGNOSIS — F411 Generalized anxiety disorder: Secondary | ICD-10-CM

## 2012-09-21 DIAGNOSIS — F419 Anxiety disorder, unspecified: Secondary | ICD-10-CM | POA: Insufficient documentation

## 2012-09-21 DIAGNOSIS — I10 Essential (primary) hypertension: Secondary | ICD-10-CM

## 2012-09-21 HISTORY — DX: Anxiety disorder, unspecified: F41.9

## 2012-09-21 HISTORY — DX: Essential (primary) hypertension: I10

## 2012-09-21 MED ORDER — ALPRAZOLAM 0.5 MG PO TABS: 0.2500 mg | ORAL_TABLET | Freq: Two times a day (BID) | ORAL | Status: DC | PRN

## 2012-09-21 MED ORDER — LISINOPRIL 5 MG PO TABS: 5.0000 mg | ORAL_TABLET | Freq: Every day | ORAL | Status: DC

## 2012-09-21 NOTE — Patient Instructions (Addendum)

## 2012-09-21 NOTE — Assessment & Plan Note (Signed)
Continue Diltiazem and Atenolol and add Lisinopril 5 mg daily

## 2012-09-21 NOTE — Progress Notes (Signed)
Patient ID: [name], female   DOB: 1941/07/16, 71 y.o.   MRN: [id number] [name] [id number] 06/29/1941 09/21/2012      Progress Note-Follow Up  Subjective  Chief Complaint  Chief Complaint  Patient presents with  . Follow-up    3 week on BP and meds    HPI  Patient is a 62 9:00 this was inserted recheck on her blood pressure. She did take her Cardizem and her Tenormin as prescribed and her blood pressure continues to run high. She denies headaches, chest pain, palpitations or symptoms suggestive her pressures been up. She does acknowledge a good friend died last night itching on the way to visit another hospice for now. She does have a family history of hypertension. No acute illness. No new over-the-counter medications. No fevers or chills congestion or headache. She has used alprazolam 0.25 mg in the past on rare occasions to help with her stress levels and find that helpful.  Past Medical History  Diagnosis Date  . ANEMIA 08/23/2008  . BACK PAIN, LUMBAR, CHRONIC 02/18/2010  . CORONARY ARTERY DISEASE 08/23/2008  . GERD 08/23/2008  . HYPERLIPIDEMIA 08/23/2008  . HYPOTHYROIDISM 08/23/2008  . OSTEOPENIA 08/23/2008  . Arthritis 11/25/2011  . Cerumen impaction 11/25/2011  . Preventative health care 08/05/2012    [facility] Dr [facility] at [facility] in [place], had a recent colonoscopy she reports being told she did not need another test for 10 years Sees dermatologist, Dr Octavia Heir  . Hyperglycemia 08/05/2012  . HTN (hypertension) 09/21/2012  . Anxiety 09/21/2012    Past Surgical History  Procedure Date  . Angioplasty 1991 & 1996  . Coronary stent placement 08/2010    Family History  Problem Relation Age of Onset  . Dementia Mother   . Arthritis Mother   . Heart disease Father   . Heart attack Father   . Hyperlipidemia Sister   . Hyperlipidemia Brother   . Cancer Maternal Aunt     breast    History   Social History  . Marital Status: Married    Spouse Name:  N/A    Number of Children: N/A  . Years of Education: N/A   Occupational History  . Not on file.   Social History Main Topics  . Smoking status: Former Smoker    Quit date: 03/02/2009  . Smokeless tobacco: Not on file  . Alcohol Use: Yes  . Drug Use: No  . Sexually Active: Not on file   Other Topics Concern  . Not on file   Social History Narrative  . No narrative on file    Current Outpatient Prescriptions on File Prior to Visit  Medication Sig Dispense Refill  . acyclovir (ZOVIRAX) 400 MG tablet TAKE 1 TABLET (400 MG TOTAL) BY MOUTH 2 (TWO) TIMES DAILY.  60 tablet  5  . aspirin 81 MG tablet Take 81 mg by mouth daily.        Marland Kitchen atenolol (TENORMIN) 50 MG tablet Take 1 tablet (50 mg total) by mouth daily.  90 tablet  3  . diclofenac (VOLTAREN) 75 MG EC tablet TAKE ONE TABLET BY MOUTH 2 TIMES DAILY AFTER MEALS  60 tablet  2  . diltiazem (CARDIZEM CD) 240 MG 24 hr capsule Take 1 capsule (240 mg total) by mouth daily.  90 capsule  3  . fish oil-omega-3 fatty acids 1000 MG capsule Take 2 g by mouth daily.        . Hydrocodone-APAP-Dietary Prod (HYDROCODONE-APAP-NUTRIT SUPP) 10-650 MG  MISC Take 1 tablet by mouth 3 (three) times daily after meals. Change sig to 1 q4h prn pain not over 3 times per day  100 each  5  . naproxen (NAPROSYN) 500 MG tablet Take 1 tablet (500 mg total) by mouth 2 (two) times daily with a meal.  60 tablet  6  . pravastatin (PRAVACHOL) 40 MG tablet Take 1 tablet (40 mg total) by mouth daily.  90 tablet  1  . prednisoLONE acetate (PRED FORTE) 1 % ophthalmic suspension Place 1 drop into the right eye daily.      [employer] CAPS Take 1 capsule by mouth 1 day or 1 dose.      Marland Kitchen lisinopril (PRINIVIL,ZESTRIL) 5 MG tablet Take 1 tablet (5 mg total) by mouth daily.  30 tablet  5  . phentermine 15 MG capsule Take 1 capsule (15 mg total) by mouth every morning.  30 capsule  0    No Known Allergies  Review of Systems  Review of Systems  Constitutional: Negative for  fever and malaise/fatigue.  HENT: Negative for congestion.   Eyes: Negative for discharge.  Respiratory: Negative for shortness of breath.   Cardiovascular: Negative for chest pain, palpitations and leg swelling.  Gastrointestinal: Negative for nausea, abdominal pain and diarrhea.  Genitourinary: Negative for dysuria.  Musculoskeletal: Negative for falls.  Skin: Negative for rash.  Neurological: Negative for loss of consciousness and headaches.  Endo/Heme/Allergies: Negative for polydipsia.  Psychiatric/Behavioral: Negative for depression and suicidal ideas. The patient is nervous/anxious. The patient does not have insomnia.     Objective  BP 178/90  Pulse 69  Temp 98.4 F (36.9 C) (Temporal)  Ht 5\' 3"  (1.6 m)  Wt 188 lb 12.8 oz (85.639 kg)  BMI 33.44 kg/m2  SpO2 97%  LMP 11/23/1993  Physical Exam  Physical Exam  Constitutional: She is oriented to person, place, and time and well-developed, well-nourished, and in no distress. No distress.  HENT:  Head: Normocephalic and atraumatic.  Eyes: Conjunctivae normal are normal.  Neck: Neck supple. No thyromegaly present.  Cardiovascular: Normal rate, regular rhythm and normal heart sounds.   No murmur heard. Pulmonary/Chest: Effort normal and breath sounds normal. She has no wheezes.  Abdominal: She exhibits no distension and no mass.  Musculoskeletal: She exhibits no edema.  Lymphadenopathy:    She has no cervical adenopathy.  Neurological: She is alert and oriented to person, place, and time.  Skin: Skin is warm and dry. No rash noted. She is not diaphoretic.  Psychiatric: Memory, affect and judgment normal.    Lab Results  Component Value Date   TSH 0.54 08/05/2012   Lab Results  Component Value Date   WBC 5.9 08/05/2012   HGB 12.2 08/05/2012   HCT 37.3 08/05/2012   MCV 94.1 08/05/2012   PLT 240.0 08/05/2012   Lab Results  Component Value Date   CREATININE 0.5 08/05/2012   BUN 18 08/05/2012   NA 138 08/05/2012   K 4.2  08/05/2012   CL 106 08/05/2012   CO2 27 08/05/2012   Lab Results  Component Value Date   ALT 23 08/05/2012   AST 23 08/05/2012   ALKPHOS 76 08/05/2012   BILITOT 0.5 08/05/2012   Lab Results  Component Value Date   CHOL 234* 08/05/2012   Lab Results  Component Value Date   HDL 25.40* 08/05/2012   No results found for this basename: LDLCALC   Lab Results  Component Value Date   TRIG 142.0  08/05/2012   Lab Results  Component Value Date   CHOLHDL 9 08/05/2012     Assessment & Plan  HTN (hypertension) Continue Diltiazem and Atenolol and add Lisinopril 5 mg daily  HYPERLIPIDEMIA Tolerating Simvastatin with good results  Anxiety Has used a small amount of Xanax in past with good results and is struggling with the death of a good friend last night and is on her way to see a friend on Hospice when she leaves here. Is given a supply of Xanax to use prn and asked to return for further conversation if symptoms worsen or she begins to feel she needs them daily

## 2012-09-21 NOTE — Assessment & Plan Note (Signed)
Has used a small amount of Xanax in past with good results and is struggling with the death of a good friend last night and is on her way to see a friend on Hospice when she leaves here. Is given a supply of Xanax to use prn and asked to return for further conversation if symptoms worsen or she begins to feel she needs them daily

## 2012-09-21 NOTE — Assessment & Plan Note (Signed)
Tolerating Simvastatin with good results 

## 2012-11-04 ENCOUNTER — Encounter: Payer: Self-pay | Admitting: Family Medicine

## 2012-11-04 ENCOUNTER — Ambulatory Visit (INDEPENDENT_AMBULATORY_CARE_PROVIDER_SITE_OTHER): Payer: Medicare Other | Admitting: Family Medicine

## 2012-11-04 VITALS — BP 128/74 | HR 65 | Temp 97.2°F | Ht 63.0 in

## 2012-11-04 DIAGNOSIS — E785 Hyperlipidemia, unspecified: Secondary | ICD-10-CM

## 2012-11-04 DIAGNOSIS — L700 Acne vulgaris: Secondary | ICD-10-CM

## 2012-11-04 DIAGNOSIS — I10 Essential (primary) hypertension: Secondary | ICD-10-CM

## 2012-11-04 DIAGNOSIS — M1990 Unspecified osteoarthritis, unspecified site: Secondary | ICD-10-CM

## 2012-11-04 DIAGNOSIS — M6740 Ganglion, unspecified site: Secondary | ICD-10-CM

## 2012-11-04 DIAGNOSIS — L708 Other acne: Secondary | ICD-10-CM

## 2012-11-04 DIAGNOSIS — R52 Pain, unspecified: Secondary | ICD-10-CM

## 2012-11-04 DIAGNOSIS — M129 Arthropathy, unspecified: Secondary | ICD-10-CM

## 2012-11-04 HISTORY — DX: Acne vulgaris: L70.0

## 2012-11-04 HISTORY — DX: Ganglion, unspecified site: M67.40

## 2012-11-04 LAB — LIPID PANEL
Cholesterol: 243 mg/dL — ABNORMAL HIGH (ref 0–200)
Total CHOL/HDL Ratio: 8
Triglycerides: 162 mg/dL — ABNORMAL HIGH (ref 0.0–149.0)

## 2012-11-04 LAB — CBC
MCHC: 33.7 g/dL (ref 30.0–36.0)
Platelets: 269 10*3/uL (ref 150.0–400.0)
RDW: 14.8 % — ABNORMAL HIGH (ref 11.5–14.6)

## 2012-11-04 LAB — RENAL FUNCTION PANEL
Albumin: 4.4 g/dL (ref 3.5–5.2)
BUN: 21 mg/dL (ref 6–23)
CO2: 28 mEq/L (ref 19–32)
Calcium: 9.6 mg/dL (ref 8.4–10.5)
Chloride: 102 mEq/L (ref 96–112)
Creatinine, Ser: 0.6 mg/dL (ref 0.4–1.2)
GFR: 101.32 mL/min (ref >60.00)
Glucose, Bld: 105 mg/dL — ABNORMAL HIGH (ref 70–99)
Phosphorus: 3.6 mg/dL (ref 2.3–4.6)
Potassium: 4.6 mEq/L (ref 3.5–5.1)
Sodium: 135 mEq/L (ref 135–145)

## 2012-11-04 LAB — HEPATIC FUNCTION PANEL
ALT: 23 U/L (ref 0–35)
AST: 22 U/L (ref 0–37)
Albumin: 4.4 g/dL (ref 3.5–5.2)
Alkaline Phosphatase: 82 U/L (ref 39–117)
Bilirubin, Direct: 0 mg/dL (ref 0.0–0.3)
Total Bilirubin: 0.7 mg/dL (ref 0.3–1.2)
Total Protein: 7.5 g/dL (ref 6.0–8.3)

## 2012-11-04 LAB — TSH: TSH: 0.45 u[IU]/mL (ref 0.35–5.50)

## 2012-11-04 MED ORDER — MINOCYCLINE HCL 50 MG PO CAPS: 50.0000 mg | ORAL_CAPSULE | Freq: Two times a day (BID) | ORAL | Status: DC

## 2012-11-04 MED ORDER — HYDROCODONE-ACETAMINOPHEN 10-325 MG PO TABS: 1.0000 | ORAL_TABLET | Freq: Three times a day (TID) | ORAL | Status: DC | PRN

## 2012-11-04 NOTE — Assessment & Plan Note (Signed)
Encouraged ice, vigorous massage and Aspercreme. Report worsening symptoms for consideration of referral.

## 2012-11-04 NOTE — Assessment & Plan Note (Signed)
Adequate control no changes. Continue diltiazem, lisinopril and atenolol

## 2012-11-04 NOTE — Assessment & Plan Note (Signed)
Worsening nodules and stiffness in hands. Encouraged fatty acids supplementation and Aspercreme when necessary. Keep her hands as active as possible

## 2012-11-04 NOTE — Assessment & Plan Note (Signed)
Has responded well to minocycline in the past. Will start her back on 50 mg twice a day as well as a probiotic. As her symptoms improve she may drop to daily

## 2012-11-04 NOTE — Progress Notes (Signed)
Patient ID: [name], female   DOB: 1941/10/01, 71 y.o.   MRN: [id number] [name] [id number] 12/13/1940 11/04/2012      Progress Note-Follow Up  Subjective  Chief Complaint  Chief Complaint  Patient presents with  . Follow-up    3 month    HPI  Patient is a 71 year old Caucasian female who is in today for followup. 2 complaints. One is a persistent cystic facial acne. Has used multiple to hold the past without great results but did get good response to minocycline in the past. Has been flared recently. Her anxiety is doing great and she is using alprazolam very infrequently but with good results. She may continue the same period she says she's felt well. No episodes of lightheadedness or headache. No chest pain, palpitations, shortness of breath, GI or GU concerns. Her biggest complaints are musculoskeletal she has stiffness and pain in her hands especially in the morning. Has a new cystic structure on her right wrist in her left forearm which are tender to palp. They are not growing, red or hot.  Past Medical History  Diagnosis Date  . ANEMIA 08/23/2008  . BACK PAIN, LUMBAR, CHRONIC 02/18/2010  . CORONARY ARTERY DISEASE 08/23/2008  . GERD 08/23/2008  . HYPERLIPIDEMIA 08/23/2008  . HYPOTHYROIDISM 08/23/2008  . OSTEOPENIA 08/23/2008  . Arthritis 11/25/2011  . Cerumen impaction 11/25/2011  . Preventative health care 08/05/2012    Sees Dr [facility]us [facility]vis [facility] at [facility] in [place], had a recent colonoscopy she reports being told she did not need another test for 10 years Sees dermatologist, Dr Octavia Heir  . Hyperglycemia 08/05/2012  . HTN (hypertension) 09/21/2012  . Anxiety 09/21/2012  . Cystic acne 11/04/2012  . Ganglion cyst 11/04/2012    Right forearm and left wrist.    Past Surgical History  Procedure Date  . Angioplasty 1991 & 1996  . Coronary stent placement 08/2010    Family History  Problem Relation Age of Onset  . Dementia Mother   . Arthritis Mother   . Heart  disease Father   . Heart attack Father   . Hyperlipidemia Sister   . Hyperlipidemia Brother   . Cancer Maternal Aunt     breast    History   Social History  . Marital Status: Married    Spouse Name: N/A    Number of Children: N/A  . Years of Education: N/A   Occupational History  . Not on file.   Social History Main Topics  . Smoking status: Former Smoker    Quit date: 03/02/2009  . Smokeless tobacco: Not on file  . Alcohol Use: Yes  . Drug Use: No  . Sexually Active: Not on file   Other Topics Concern  . Not on file   Social History Narrative  . No narrative on file    Current Outpatient Prescriptions on File Prior to Visit  Medication Sig Dispense Refill  . acyclovir (ZOVIRAX) 400 MG tablet TAKE 1 TABLET (400 MG TOTAL) BY MOUTH 2 (TWO) TIMES DAILY.  60 tablet  5  . aspirin 81 MG tablet Take 81 mg by mouth daily.        Marland Kitchen atenolol (TENORMIN) 50 MG tablet Take 1 tablet (50 mg total) by mouth daily.  90 tablet  3  . diclofenac (VOLTAREN) 75 MG EC tablet TAKE ONE TABLET BY MOUTH 2 TIMES DAILY AFTER MEALS  60 tablet  2  . diltiazem (CARDIZEM CD) 240 MG 24 hr capsule Take 1 capsule (240 mg  total) by mouth daily.  90 capsule  3  . fish oil-omega-3 fatty acids 1000 MG capsule Take 2 g by mouth daily.        [facility] CAPS Take 1 capsule by mouth 1 day or 1 dose.      Marland Kitchen lisinopril (PRINIVIL,ZESTRIL) 5 MG tablet Take 1 tablet (5 mg total) by mouth daily.  30 tablet  5  . naproxen (NAPROSYN) 500 MG tablet Take 1 tablet (500 mg total) by mouth 2 (two) times daily with a meal.  60 tablet  6  . pravastatin (PRAVACHOL) 40 MG tablet Take 1 tablet (40 mg total) by mouth daily.  90 tablet  1  . prednisoLONE acetate (PRED FORTE) 1 % ophthalmic suspension Place 1 drop into the right eye daily.      Marland Kitchen ALPRAZolam (XANAX) 0.5 MG tablet Take 0.5 tablets (0.25 mg total) by mouth 2 (two) times daily as needed for sleep or anxiety.  60 tablet  1    No Known Allergies  Review of  Systems  Review of Systems  Constitutional: Negative for fever and malaise/fatigue.  HENT: Negative for congestion.   Eyes: Negative for discharge.  Respiratory: Negative for shortness of breath.   Cardiovascular: Negative for chest pain, palpitations and leg swelling.  Gastrointestinal: Negative for nausea, abdominal pain and diarrhea.  Genitourinary: Negative for dysuria.  Musculoskeletal: Positive for myalgias and joint pain. Negative for falls.  Skin: Negative for rash.       acne  Neurological: Negative for loss of consciousness and headaches.  Endo/Heme/Allergies: Negative for polydipsia.  Psychiatric/Behavioral: Negative for depression and suicidal ideas. The patient is not nervous/anxious and does not have insomnia.     Objective  BP 128/74  Pulse 65  Temp 97.2 F (36.2 C) (Temporal)  Ht 5\' 3"  (1.6 m)  SpO2 99%  LMP 11/23/1993  Physical Exam  Physical Exam  Constitutional: She is oriented to person, place, and time and well-developed, well-nourished, and in no distress. No distress.  HENT:  Head: Normocephalic and atraumatic.  Eyes: Conjunctivae normal are normal.  Neck: Neck supple. No thyromegaly present.  Cardiovascular: Normal rate, regular rhythm and normal heart sounds.   No murmur heard. Pulmonary/Chest: Effort normal and breath sounds normal. She has no wheezes.  Abdominal: She exhibits no distension and no mass.  Musculoskeletal: She exhibits no edema.       Nodules at DIP joints. Cyst at right wrist, radial head and left forearm, midshaft along tendon  Lymphadenopathy:    She has no cervical adenopathy.  Neurological: She is alert and oriented to person, place, and time.  Skin: Skin is warm and dry. No rash noted. She is not diaphoretic.       Cystic acne on face, chin  Psychiatric: Memory, affect and judgment normal.    Lab Results  Component Value Date   TSH 0.54 08/05/2012   Lab Results  Component Value Date   WBC 5.9 08/05/2012   HGB 12.2  08/05/2012   HCT 37.3 08/05/2012   MCV 94.1 08/05/2012   PLT 240.0 08/05/2012   Lab Results  Component Value Date   CREATININE 0.5 08/05/2012   BUN 18 08/05/2012   NA 138 08/05/2012   K 4.2 08/05/2012   CL 106 08/05/2012   CO2 27 08/05/2012   Lab Results  Component Value Date   ALT 23 08/05/2012   AST 23 08/05/2012   ALKPHOS 76 08/05/2012   BILITOT 0.5 08/05/2012   Lab Results  Component Value Date   CHOL 234* 08/05/2012   Lab Results  Component Value Date   HDL 25.40* 08/05/2012   No results found for this basename: [facility]   Lab Results  Component Value Date   TRIG 142.0 08/05/2012   Lab Results  Component Value Date   CHOLHDL 9 08/05/2012     Assessment & Plan  Cystic acne Has responded well to minocycline in the past. Will start her back on 50 mg twice a day as well as a probiotic. As her symptoms improve she may drop to daily  HTN (hypertension) Adequate control no changes. Continue diltiazem, lisinopril and atenolol  Arthritis Worsening nodules and stiffness in hands. Encouraged fatty acids supplementation and Aspercreme when necessary. Keep her hands as active as possible  Ganglion cyst Encouraged ice, vigorous massage and Aspercreme. Report worsening symptoms for consideration of referral.  HYPERLIPIDEMIA Repeat lipid panel today. Tolerating pravastatin well at this time. Did start Zocor quite some time ago.

## 2012-11-04 NOTE — Assessment & Plan Note (Signed)
Repeat lipid panel today. Tolerating pravastatin well at this time. Did start Zocor quite some time ago.

## 2012-11-04 NOTE — Patient Instructions (Addendum)
aspercreme to nodules

## 2012-11-07 ENCOUNTER — Telehealth: Payer: Self-pay

## 2012-11-07 NOTE — Telephone Encounter (Signed)
Message copied by Court Joy on Mon Nov 07, 2012 12:23 PM ------      Message from: Danise Edge A      Created: Fri Nov 04, 2012  9:55 PM       Please let her know her cholesterol is up recommend a switch to Crestor from [facility] because it is a little stronger. Let her know it is a bit more expensive but see if she is willing to try it. Disp 40 mg tabs 1 tab po daily, disp #30, 3 rf. Otherwise we can add a second a second medication to help with her numbers.

## 2012-11-07 NOTE — Telephone Encounter (Signed)
Let a message with pts spouse to return my call

## 2012-11-08 MED ORDER — ROSUVASTATIN CALCIUM 40 MG PO TABS: 40.0000 mg | ORAL_TABLET | Freq: Every day | ORAL | Status: DC

## 2012-11-08 NOTE — Telephone Encounter (Signed)
Pt informed and stated that we can send in the Crestor and she will see how much it is. RX sent

## 2012-12-08 ENCOUNTER — Other Ambulatory Visit: Payer: Self-pay | Admitting: Family Medicine

## 2012-12-22 ENCOUNTER — Telehealth: Payer: Self-pay | Admitting: Family Medicine

## 2012-12-22 NOTE — Telephone Encounter (Signed)
Please advise? Or does patient need an appt?

## 2012-12-22 NOTE — Telephone Encounter (Signed)
Left a message for patient to return my call. 

## 2012-12-22 NOTE — Telephone Encounter (Signed)
She needs an appt to start Phentermine due to the risk factors and our need to monitor bp and pulse closely

## 2012-12-26 NOTE — Telephone Encounter (Signed)
Pt informed and states she will call back to schedule an appt

## 2013-01-25 ENCOUNTER — Other Ambulatory Visit: Payer: Self-pay

## 2013-01-25 DIAGNOSIS — Z1231 Encounter for screening mammogram for malignant neoplasm of breast: Secondary | ICD-10-CM

## 2013-01-26 ENCOUNTER — Encounter: Payer: Self-pay | Admitting: Family Medicine

## 2013-02-20 ENCOUNTER — Ambulatory Visit
Admission: RE | Admit: 2013-02-20 | Discharge: 2013-02-20 | Disposition: A | Discharge status: Home / Self Care | Payer: Medicare Other | Source: Ambulatory Visit

## 2013-02-20 DIAGNOSIS — Z1231 Encounter for screening mammogram for malignant neoplasm of breast: Secondary | ICD-10-CM

## 2013-03-09 ENCOUNTER — Other Ambulatory Visit: Payer: Self-pay | Admitting: Family Medicine

## 2013-03-09 NOTE — Telephone Encounter (Signed)
Rx request to pharmacy/[facility]  

## 2013-03-24 ENCOUNTER — Encounter: Payer: Self-pay | Admitting: Nurse Practitioner

## 2013-03-24 ENCOUNTER — Ambulatory Visit (INDEPENDENT_AMBULATORY_CARE_PROVIDER_SITE_OTHER): Payer: Medicare Other | Admitting: Nurse Practitioner

## 2013-03-24 VITALS — BP 120/70 | HR 65 | Temp 98.6°F | Ht 63.0 in

## 2013-03-24 DIAGNOSIS — K2990 Gastroduodenitis, unspecified, without bleeding: Secondary | ICD-10-CM

## 2013-03-24 DIAGNOSIS — K2970 Gastritis, unspecified, without bleeding: Secondary | ICD-10-CM | POA: Insufficient documentation

## 2013-03-24 DIAGNOSIS — I10 Essential (primary) hypertension: Secondary | ICD-10-CM

## 2013-03-24 MED ORDER — FAMOTIDINE 20 MG PO TABS: 20.0000 mg | ORAL_TABLET | Freq: Two times a day (BID) | ORAL | Status: DC

## 2013-03-24 NOTE — Progress Notes (Signed)
Patient ID: [name], female   DOB: 1941-08-18, 72 y.o.   MRN: [id number] Subjective:     [name] is a 72 y.o. female who presents for evaluation of abdominal pain. Onset was 2 months ago. Symptoms have been unchanged. The pain is described as aching and cramping, and is 4/10 in intensity. Pain is located in the epigastric region without radiation.  Aggravating factors: eating large meal in evening, small meals during day do not cause symptoms. Alleviating factors: bowel movements. Associated symptoms: nausea. The patient denies anorexia, dysuria, fever, headache, melena and vomiting. Pt reports normal colonoscopy within last 6 mos.  Pt also reports dizziness with position changes since starting lisinopril.  The patient's history has been marked as reviewed and updated as appropriate.  Review of Systems Constitutional: negative Respiratory: negative for cough and dyspnea on exertion Cardiovascular: negative for chest pain, dyspnea, fatigue and palpitations Gastrointestinal: positive for abdominal pain, constipation, diarrhea and nausea, negative for change in bowel habits, dyspepsia, dysphagia, jaundice, melena and vomiting     Objective:    BP 120/70  Pulse 65  Temp(Src) 98.6 F (37 C) (Oral)  Ht 5\' 3"  (1.6 m)  SpO2 99%  LMP 11/23/1993 General appearance: alert, cooperative, appears stated age and no distress Head: Normocephalic, without obvious abnormality, atraumatic Eyes: negative findings: conjunctivae and sclerae normal Lungs: clear to auscultation bilaterally Heart: regular rate and rhythm, S1, S2 normal, no murmur, click, rub or gallop Abdomen: normal findings: bowel sounds normal, no bruits heard, no masses palpable and no organomegaly and abnormal findings:  tender epigastric and RLQ to palpation.    Assessment:    Abdominal pain, likely secondary to NSAIDS use causing gastric irritation.   Dizzyness with position changes Plan:    The diagnosis was discussed with  the patient and evaluation and treatment plans outlined. Follow up in 4 weeks or as needed.  Monitor BP at home. If consistently around 120/70, may consider  Discontinuing lisinopril.

## 2013-03-24 NOTE — Patient Instructions (Signed)
Your symptoms are likely related to gastritis. Stop diclofenac for 4 weeks. Increase pepcid to twice daily for 4 weeks. Start probiotics (Align) daily until you are seen again. Let's see you again in 4 weeks for a long-term plan. Call us or your GI doctor sooner if you develop new symptoms.

## 2013-04-11 ENCOUNTER — Telehealth: Payer: Self-pay | Admitting: Family Medicine

## 2013-04-11 MED ORDER — ACYCLOVIR 400 MG PO TABS: ORAL_TABLET | ORAL | Status: DC

## 2013-04-11 NOTE — Telephone Encounter (Signed)
Refill- acyclovir 400mg  tab. Take one tablet(400mg  total) by mouth two times daily. Qty 60 last fill 3.28.14

## 2013-04-11 NOTE — Telephone Encounter (Signed)
Rx sent in to pharmacy. 

## 2013-04-12 ENCOUNTER — Telehealth: Payer: Self-pay | Admitting: Family Medicine

## 2013-04-12 MED ORDER — LISINOPRIL 5 MG PO TABS: 5.0000 mg | ORAL_TABLET | Freq: Every day | ORAL | Status: DC

## 2013-04-12 NOTE — Telephone Encounter (Signed)
OK to send in Lisinopril 5 mg po daily disp #90, 1 rf

## 2013-04-21 ENCOUNTER — Ambulatory Visit: Payer: Medicare Other | Admitting: Nurse Practitioner

## 2013-05-05 ENCOUNTER — Telehealth: Payer: Self-pay | Admitting: Family Medicine

## 2013-05-05 MED ORDER — SUCRALFATE 1 G PO TABS: 1.0000 g | ORAL_TABLET | Freq: Four times a day (QID) | ORAL | Status: DC

## 2013-05-05 NOTE — Telephone Encounter (Signed)
Patient got samples from Dr. Scotty Court. Can she get refill? Patient says the samples don't have a strength listed on the bottle.

## 2013-05-05 NOTE — Telephone Encounter (Signed)
Please advise 

## 2013-05-05 NOTE — Telephone Encounter (Signed)
She can have carafate tabs (1 strength) 1 tab po qid prn heartburn, disp # 120, 1 rf

## 2013-06-20 ENCOUNTER — Other Ambulatory Visit: Payer: Self-pay | Admitting: Family Medicine

## 2013-06-30 ENCOUNTER — Ambulatory Visit: Payer: Medicare Other | Admitting: Family Medicine

## 2013-07-15 ENCOUNTER — Other Ambulatory Visit: Payer: Self-pay | Admitting: Family Medicine

## 2013-07-17 NOTE — Telephone Encounter (Signed)
I will send in a 30 day supply since pt has appt next month

## 2013-08-08 ENCOUNTER — Ambulatory Visit (INDEPENDENT_AMBULATORY_CARE_PROVIDER_SITE_OTHER): Payer: Medicare Other | Admitting: Family Medicine

## 2013-08-08 ENCOUNTER — Telehealth: Payer: Self-pay | Admitting: Family Medicine

## 2013-08-08 ENCOUNTER — Encounter: Payer: Self-pay | Admitting: Family Medicine

## 2013-08-08 ENCOUNTER — Other Ambulatory Visit: Payer: Self-pay | Admitting: Family Medicine

## 2013-08-08 VITALS — BP 138/80 | HR 53 | Temp 97.8°F | Ht 63.0 in | Wt 188.1 lb

## 2013-08-08 DIAGNOSIS — K219 Gastro-esophageal reflux disease without esophagitis: Secondary | ICD-10-CM

## 2013-08-08 DIAGNOSIS — D649 Anemia, unspecified: Secondary | ICD-10-CM

## 2013-08-08 DIAGNOSIS — I10 Essential (primary) hypertension: Secondary | ICD-10-CM

## 2013-08-08 DIAGNOSIS — N200 Calculus of kidney: Secondary | ICD-10-CM

## 2013-08-08 DIAGNOSIS — R52 Pain, unspecified: Secondary | ICD-10-CM

## 2013-08-08 DIAGNOSIS — E039 Hypothyroidism, unspecified: Secondary | ICD-10-CM

## 2013-08-08 DIAGNOSIS — M129 Arthropathy, unspecified: Secondary | ICD-10-CM

## 2013-08-08 DIAGNOSIS — F419 Anxiety disorder, unspecified: Secondary | ICD-10-CM

## 2013-08-08 DIAGNOSIS — R739 Hyperglycemia, unspecified: Secondary | ICD-10-CM

## 2013-08-08 DIAGNOSIS — M1990 Unspecified osteoarthritis, unspecified site: Secondary | ICD-10-CM

## 2013-08-08 DIAGNOSIS — Z23 Encounter for immunization: Secondary | ICD-10-CM

## 2013-08-08 DIAGNOSIS — E785 Hyperlipidemia, unspecified: Secondary | ICD-10-CM

## 2013-08-08 DIAGNOSIS — R7309 Other abnormal glucose: Secondary | ICD-10-CM

## 2013-08-08 DIAGNOSIS — M899 Disorder of bone, unspecified: Secondary | ICD-10-CM

## 2013-08-08 DIAGNOSIS — B9789 Other viral agents as the cause of diseases classified elsewhere: Secondary | ICD-10-CM

## 2013-08-08 DIAGNOSIS — F411 Generalized anxiety disorder: Secondary | ICD-10-CM

## 2013-08-08 LAB — CBC
HCT: 38.4 % (ref 36.0–46.0)
Hemoglobin: 12.7 g/dL (ref 12.0–15.0)
MCH: 30.5 pg (ref 26.0–34.0)
MCHC: 33.1 g/dL (ref 30.0–36.0)
MCV: 92.1 fL (ref 78.0–100.0)

## 2013-08-08 LAB — HEPATIC FUNCTION PANEL
ALT: 18 U/L (ref 0–35)
Indirect Bilirubin: 0.3 mg/dL (ref 0.0–0.9)
Total Protein: 6.7 g/dL (ref 6.0–8.3)

## 2013-08-08 LAB — HEMOGLOBIN A1C: Hgb A1c MFr Bld: 5.8 % — ABNORMAL HIGH (ref <5.7)

## 2013-08-08 LAB — RENAL FUNCTION PANEL
Albumin: 4.1 g/dL (ref 3.5–5.2)
BUN: 17 mg/dL (ref 6–23)
Calcium: 9.6 mg/dL (ref 8.4–10.5)
Glucose, Bld: 88 mg/dL (ref 70–99)

## 2013-08-08 LAB — LIPID PANEL
Cholesterol: 159 mg/dL (ref 0–200)
VLDL: 25 mg/dL (ref 0–40)

## 2013-08-08 LAB — TSH: TSH: 0.74 u[IU]/mL (ref 0.350–4.500)

## 2013-08-08 MED ORDER — ATENOLOL 50 MG PO TABS: 50.0000 mg | ORAL_TABLET | Freq: Every day | ORAL | Status: DC

## 2013-08-08 MED ORDER — LISINOPRIL 5 MG PO TABS: 5.0000 mg | ORAL_TABLET | Freq: Two times a day (BID) | ORAL | Status: DC

## 2013-08-08 MED ORDER — ACYCLOVIR 400 MG PO TABS: ORAL_TABLET | ORAL | Status: DC

## 2013-08-08 MED ORDER — DILTIAZEM HCL ER COATED BEADS 240 MG PO CP24: 240.0000 mg | ORAL_CAPSULE | Freq: Every day | ORAL | Status: DC

## 2013-08-08 MED ORDER — ALPRAZOLAM 0.5 MG PO TABS: 0.2500 mg | ORAL_TABLET | Freq: Two times a day (BID) | ORAL | Status: DC | PRN

## 2013-08-08 MED ORDER — MINOCYCLINE HCL 50 MG PO CAPS: 50.0000 mg | ORAL_CAPSULE | Freq: Two times a day (BID) | ORAL | Status: DC

## 2013-08-08 MED ORDER — HYDROCODONE-ACETAMINOPHEN 10-325 MG PO TABS: 1.0000 | ORAL_TABLET | Freq: Three times a day (TID) | ORAL | Status: DC | PRN

## 2013-08-08 MED ORDER — PAROXETINE HCL 10 MG PO TABS: 10.0000 mg | ORAL_TABLET | ORAL | Status: DC

## 2013-08-08 MED ORDER — SUCRALFATE 1 G PO TABS: 1.0000 g | ORAL_TABLET | Freq: Four times a day (QID) | ORAL | Status: DC

## 2013-08-08 MED ORDER — PREDNISOLONE ACETATE 1 % OP SUSP: 1.0000 [drp] | Freq: Every day | OPHTHALMIC | Status: AC | PRN

## 2013-08-08 NOTE — Patient Instructions (Addendum)
DASH Diet  The DASH diet stands for "Dietary Approaches to Stop Hypertension." It is a healthy eating plan that has been shown to reduce high blood pressure (hypertension) in as little as 14 days, while also possibly providing other significant health benefits. These other health benefits include reducing the risk of breast cancer after menopause and reducing the risk of type 2 diabetes, heart disease, colon cancer, and stroke. Health benefits also include weight loss and slowing kidney failure in patients with chronic kidney disease.   DIET GUIDELINES  · Limit salt (sodium). Your diet should contain less than 1500 mg of sodium daily.  · Limit refined or processed carbohydrates. Your diet should include mostly whole grains. Desserts and added sugars should be used sparingly.  · Include small amounts of heart-healthy fats. These types of fats include nuts, oils, and tub margarine. Limit saturated and trans fats. These fats have been shown to be harmful in the body.  CHOOSING FOODS   The following food groups are based on a 2000 calorie diet. See your Registered Dietitian for individual calorie needs.  Grains and Grain Products (6 to 8 servings daily)  · Eat More Often: Whole-wheat bread, brown rice, whole-grain or wheat pasta, quinoa, popcorn without added fat or salt (air popped).  · Eat Less Often: White bread, white pasta, white rice, cornbread.  Vegetables (4 to 5 servings daily)  · Eat More Often: Fresh, frozen, and canned vegetables. Vegetables may be raw, steamed, roasted, or grilled with a minimal amount of fat.  · Eat Less Often/Avoid: Creamed or fried vegetables. Vegetables in a cheese sauce.  Fruit (4 to 5 servings daily)  · Eat More Often: All fresh, canned (in natural juice), or frozen fruits. Dried fruits without added sugar. One hundred percent fruit juice (½ cup [237 mL] daily).  · Eat Less Often: Dried fruits with added sugar. Canned fruit in light or heavy syrup.  Lean Meats, Fish, and Poultry (2  servings or less daily. One serving is 3 to 4 oz [85-114 g]).  · Eat More Often: Ninety percent or leaner ground beef, tenderloin, sirloin. Round cuts of beef, chicken breast, turkey breast. All fish. Grill, bake, or broil your meat. Nothing should be fried.  · Eat Less Often/Avoid: Fatty cuts of meat, turkey, or chicken leg, thigh, or wing. Fried cuts of meat or fish.  Dairy (2 to 3 servings)  · Eat More Often: Low-fat or fat-free milk, low-fat plain or light yogurt, reduced-fat or part-skim cheese.  · Eat Less Often/Avoid: Milk (whole, 2%). Whole milk yogurt. Full-fat cheeses.  Nuts, Seeds, and Legumes (4 to 5 servings per week)  · Eat More Often: All without added salt.  · Eat Less Often/Avoid: Salted nuts and seeds, canned beans with added salt.  Fats and Sweets (limited)  · Eat More Often: Vegetable oils, tub margarines without trans fats, sugar-free gelatin. Mayonnaise and salad dressings.  · Eat Less Often/Avoid: Coconut oils, palm oils, butter, stick margarine, cream, half and half, cookies, candy, pie.  FOR MORE INFORMATION  The Dash Diet Eating Plan: www.dashdiet.org  Document Released: 10/29/2011 Document Revised: 02/01/2012 Document Reviewed: 10/29/2011  Exit[employer]® Patient Information ©2014 [employer].

## 2013-08-08 NOTE — Progress Notes (Signed)
Patient ID: [name], female   DOB: Jan 08, 1941, 72 y.o.   MRN: [id number] [name] [id number] 1941/08/30 08/08/2013      Progress Note New Patient  Subjective  Chief Complaint  Chief Complaint  Patient presents with  . Follow-up    med refills  . Injections    flu    HPI  Patient behavioral female who is in today for followup. Overall she's doing somewhat better. Her abdominal pain and reflux are somewhat improved. Is just using Carafate in the mornings intermittently. She's not had any recent kidney stones or fevers. Denies nausea vomiting, chest pain or palpitations. No shortness of breath. Having a lot of persistent pain. Has wrist pain as well as hip and knee pain. Chronic back pain and is being followed by Dr. Sherryle Lis.  Past Medical History  Diagnosis Date  . ANEMIA 08/23/2008  . BACK PAIN, LUMBAR, CHRONIC 02/18/2010  . CORONARY ARTERY DISEASE 08/23/2008  . GERD 08/23/2008  . HYPERLIPIDEMIA 08/23/2008  . HYPOTHYROIDISM 08/23/2008  . OSTEOPENIA 08/23/2008  . Arthritis 11/25/2011  . Cerumen impaction 11/25/2011  . Preventative health care 08/05/2012    Sees Dr Markus Jarvis [facility] at [facility] in [place], had a recent colonoscopy she reports being told she did not need another test for 10 years Sees dermatologist, Dr Octavia Heir  . Hyperglycemia 08/05/2012  . HTN (hypertension) 09/21/2012  . Anxiety 09/21/2012  . Cystic acne 11/04/2012  . Ganglion cyst 11/04/2012    Right forearm and left wrist.    Past Surgical History  Procedure Laterality Date  . Angioplasty  1991 & 1996  . Coronary stent placement  08/2010  . Cholecystectomy      Family History  Problem Relation Age of Onset  . Dementia Mother   . Arthritis Mother   . Heart disease Father   . Heart attack Father   . Hyperlipidemia Sister   . Hyperlipidemia Brother   . Cancer Maternal Aunt     breast    History   Social History  . Marital Status: Married    Spouse Name: N/A    Number of Children: N/A  .  Years of Education: N/A   Occupational History  . Not on file.   Social History Main Topics  . Smoking status: Former Smoker    Quit date: 03/02/2009  . Smokeless tobacco: Not on file  . Alcohol Use: Yes  . Drug Use: No  . Sexual Activity: Not on file   Other Topics Concern  . Not on file   Social History Narrative  . No narrative on file    Current Outpatient Prescriptions on File Prior to Visit  Medication Sig Dispense Refill  . aspirin 81 MG tablet Take 81 mg by mouth daily.        . diclofenac (VOLTAREN) 75 MG EC tablet TAKE ONE TABLET BY MOUTH 2 TIMES DAILY AFTER MEALS  60 tablet  2  . famotidine (PEPCID) 20 MG tablet Take 1 tablet (20 mg total) by mouth 2 (two) times daily.  60 tablet  1  . fish oil-omega-3 fatty acids 1000 MG capsule Take 2 g by mouth daily.        [facility] CAPS Take 1 capsule by mouth 1 day or 1 dose.      . pravastatin (PRAVACHOL) 40 MG tablet TAKE ONE TABLET BY MOUTH DAILY  90 tablet  1  . ranitidine (ZANTAC) 150 MG capsule Take by mouth 2 (two) times daily.      Marland Kitchen  rosuvastatin (CRESTOR) 40 MG tablet Take 1 tablet (40 mg total) by mouth daily.  30 tablet  3   No current facility-administered medications on file prior to visit.    No Known Allergies  Review of Systems  Review of Systems  Constitutional: Negative for fever, chills and malaise/fatigue.  HENT: Negative for hearing loss, nosebleeds and congestion.   Eyes: Positive for pain. Negative for discharge.  Respiratory: Negative for cough, sputum production, shortness of breath and wheezing.   Cardiovascular: Negative for chest pain, palpitations and leg swelling.  Gastrointestinal: Positive for heartburn and abdominal pain. Negative for nausea, vomiting, diarrhea, constipation and blood in stool.  Genitourinary: Negative for dysuria, urgency, frequency and hematuria.  Musculoskeletal: Positive for back pain and joint pain. Negative for myalgias and falls.  Skin: Negative for rash.   Neurological: Negative for dizziness, tremors, sensory change, focal weakness, loss of consciousness, weakness and headaches.  Endo/Heme/Allergies: Negative for polydipsia. Does not bruise/bleed easily.  Psychiatric/Behavioral: Negative for depression and suicidal ideas. The patient is not nervous/anxious and does not have insomnia.     Objective  BP 138/80  Pulse 53  Temp(Src) 97.8 F (36.6 C) (Oral)  Ht 5\' 3"  (1.6 m)  Wt 188 lb 1.9 oz (85.331 kg)  BMI 33.33 kg/m2  SpO2 97%  LMP 11/23/1993  Physical Exam  Physical Exam  Constitutional: She is oriented to person, place, and time and well-developed, well-nourished, and in no distress. No distress.  HENT:  Head: Normocephalic and atraumatic.  Right Ear: External ear normal.  Left Ear: External ear normal.  Nose: Nose normal.  Mouth/Throat: Oropharynx is clear and moist. No oropharyngeal exudate.  Eyes: Conjunctivae are normal. Pupils are equal, round, and reactive to light. Right eye exhibits no discharge. Left eye exhibits no discharge. No scleral icterus.  Neck: Normal range of motion. Neck supple. No thyromegaly present.  Cardiovascular: Normal rate, regular rhythm, normal heart sounds and intact distal pulses.   No murmur heard. Pulmonary/Chest: Effort normal and breath sounds normal. No respiratory distress. She has no wheezes. She has no rales.  Abdominal: Soft. Bowel sounds are normal. She exhibits no distension and no mass. There is no tenderness.  Musculoskeletal: Normal range of motion. She exhibits no edema and no tenderness.  Lymphadenopathy:    She has no cervical adenopathy.  Neurological: She is alert and oriented to person, place, and time. She has normal reflexes. No cranial nerve deficit. Coordination normal.  Skin: Skin is warm and dry. No rash noted. She is not diaphoretic.  Psychiatric: Mood, memory and affect normal.       Assessment & Plan  HTN (hypertension) Well controlled no  changes.  HYPOTHYROIDISM Well treated. No changes.   GERD Doing better, using Carafate infrequently. Using Pepcid prn avoid offending foods.   Kidney stone Recently seen at hospital in [facility], will request records and is referred to urology for futher management.   Arthritis Chronic pain noted, encouraged activity as tolerated and Salon Pas prn

## 2013-08-08 NOTE — Telephone Encounter (Signed)
LAB ODER WEEK OF 11-3-2014Annual exam with labs lipid, renal, cbc, tsh, heptic

## 2013-08-09 LAB — URINALYSIS
Glucose, UA: NEGATIVE mg/dL
Nitrite: NEGATIVE
Protein, ur: NEGATIVE mg/dL
pH: 6 (ref 5.0–8.0)

## 2013-08-12 ENCOUNTER — Encounter: Payer: Self-pay | Admitting: Family Medicine

## 2013-08-12 DIAGNOSIS — N200 Calculus of kidney: Secondary | ICD-10-CM

## 2013-08-12 HISTORY — DX: Calculus of kidney: N20.0

## 2013-08-12 NOTE — Assessment & Plan Note (Signed)
Doing better, using Carafate infrequently. Using Pepcid prn avoid offending foods.

## 2013-08-12 NOTE — Assessment & Plan Note (Addendum)
Recently seen at hospital in [facility], will request records and is referred to urology for futher management.

## 2013-08-12 NOTE — Assessment & Plan Note (Signed)
Chronic pain noted, encouraged activity as tolerated and Salon Pas prn

## 2013-08-12 NOTE — Assessment & Plan Note (Signed)
Well treated. No changes 

## 2013-08-12 NOTE — Assessment & Plan Note (Signed)
Well controlled no changes 

## 2013-09-11 ENCOUNTER — Telehealth: Payer: Self-pay | Admitting: Family Medicine

## 2013-09-11 MED ORDER — MINOCYCLINE HCL 50 MG PO CAPS: 50.0000 mg | ORAL_CAPSULE | Freq: Two times a day (BID) | ORAL | Status: DC

## 2013-09-11 NOTE — Telephone Encounter (Signed)
Refill- minocycline 50mg  capsule. Take one capsule by mouth two times daily. Qty 60 last fill 8.25.14

## 2013-09-19 ENCOUNTER — Other Ambulatory Visit: Payer: Self-pay | Admitting: Family Medicine

## 2013-10-05 ENCOUNTER — Encounter: Payer: Medicare Other | Admitting: Family Medicine

## 2013-10-16 LAB — CBC
HCT: 37.1 % (ref 36.0–46.0)
Hemoglobin: 12.5 g/dL (ref 12.0–15.0)
MCV: 89.4 fL (ref 78.0–100.0)
RBC: 4.15 MIL/uL (ref 3.87–5.11)
WBC: 6.3 10*3/uL (ref 4.0–10.5)

## 2013-10-16 LAB — HEPATIC FUNCTION PANEL
ALT: 16 U/L (ref 0–35)
AST: 17 U/L (ref 0–37)
Alkaline Phosphatase: 109 U/L (ref 39–117)
Bilirubin, Direct: 0.1 mg/dL (ref 0.0–0.3)
Indirect Bilirubin: 0.4 mg/dL (ref 0.0–0.9)
Total Bilirubin: 0.5 mg/dL (ref 0.3–1.2)

## 2013-10-16 LAB — LIPID PANEL
LDL Cholesterol: 115 mg/dL — ABNORMAL HIGH (ref 0–99)
Triglycerides: 104 mg/dL (ref <150)

## 2013-10-16 LAB — RENAL FUNCTION PANEL
CO2: 27 mEq/L (ref 19–32)
Chloride: 103 mEq/L (ref 96–112)
Creat: 0.58 mg/dL (ref 0.50–1.10)
Phosphorus: 3.3 mg/dL (ref 2.3–4.6)
Potassium: 4 mEq/L (ref 3.5–5.3)

## 2013-10-23 ENCOUNTER — Ambulatory Visit (INDEPENDENT_AMBULATORY_CARE_PROVIDER_SITE_OTHER): Payer: Medicare Other | Admitting: Family Medicine

## 2013-10-23 ENCOUNTER — Encounter: Payer: Self-pay | Admitting: Family Medicine

## 2013-10-23 ENCOUNTER — Telehealth: Payer: Self-pay | Admitting: Family Medicine

## 2013-10-23 VITALS — BP 122/72 | HR 55 | Temp 97.7°F | Ht 63.0 in | Wt 188.0 lb

## 2013-10-23 DIAGNOSIS — R7309 Other abnormal glucose: Secondary | ICD-10-CM

## 2013-10-23 DIAGNOSIS — Z0000 Encounter for general adult medical examination without abnormal findings: Secondary | ICD-10-CM

## 2013-10-23 DIAGNOSIS — R739 Hyperglycemia, unspecified: Secondary | ICD-10-CM

## 2013-10-23 DIAGNOSIS — I10 Essential (primary) hypertension: Secondary | ICD-10-CM

## 2013-10-23 DIAGNOSIS — K219 Gastro-esophageal reflux disease without esophagitis: Secondary | ICD-10-CM

## 2013-10-23 DIAGNOSIS — E785 Hyperlipidemia, unspecified: Secondary | ICD-10-CM

## 2013-10-23 DIAGNOSIS — L700 Acne vulgaris: Secondary | ICD-10-CM

## 2013-10-23 DIAGNOSIS — E039 Hypothyroidism, unspecified: Secondary | ICD-10-CM

## 2013-10-23 DIAGNOSIS — D649 Anemia, unspecified: Secondary | ICD-10-CM

## 2013-10-23 DIAGNOSIS — L708 Other acne: Secondary | ICD-10-CM

## 2013-10-23 DIAGNOSIS — I2510 Atherosclerotic heart disease of native coronary artery without angina pectoris: Secondary | ICD-10-CM

## 2013-10-23 MED ORDER — MINOCYCLINE HCL 50 MG PO CAPS: 100.0000 mg | ORAL_CAPSULE | Freq: Two times a day (BID) | ORAL | Status: DC

## 2013-10-23 NOTE — Telephone Encounter (Signed)
Lab order week of 02-13-2014 Next visit lipid, rheumatoid factor, liver, renal, cbc, tsh, hg ba1c, urinalysis

## 2013-10-23 NOTE — Telephone Encounter (Signed)
Lab order placed.

## 2013-10-23 NOTE — Progress Notes (Signed)
Pre visit review using our clinic review tool, if applicable. No additional management support is needed unless otherwise documented below in the visit note. 

## 2013-10-23 NOTE — Patient Instructions (Signed)
Digestive Advantage probiotic daily  Aspercreme or [facility] cream  As needed Aleve/Naproxen 220 mg 1 in am with food or Ibuprofen/Advil or Motrin 200 mg 2 in am with food   Basic Carbohydrate Counting Basic carbohydrate counting is a way to plan meals. It is done by counting the amount of carbohydrate in foods. Foods that have carbohydrates are starches (grains, beans, starchy vegetables) and sweets. Eating carbohydrates increases blood glucose (sugar) levels. People with diabetes use carbohydrate counting to help keep their blood glucose at a normal level.  COUNTING CARBOHYDRATES IN FOODS The first step in counting carbohydrates is to learn how many carbohydrate servings you should have in every meal. A dietitian can plan this for you. After learning the amount of carbohydrates to include in your meal plan, you can start to choose the carbohydrate-containing foods you want to eat.  There are 2 ways to identify the amount of carbohydrates in the foods you eat.  Read the Nutrition Facts panel on food labels. You need 2 pieces of information from the Nutrition Facts panel to count carbohydrates this way:  Serving size.  Total carbohydrate (in grams). Decide how many servings you will be eating. If it is 1 serving, you will be eating the amount of carbohydrate listed on the panel. If you will be eating 2 servings, you will be eating double the amount of carbohydrate listed on the panel.   Learn serving sizes. A serving size of most carbohydrate-containing foods is about 15 grams (g). Listed below are single serving sizes of common carbohydrate-containing foods:  1 slice bread.   cup unsweetened, dry cereal.   cup hot cereal.   cup rice.   cup mashed potatoes.   cup pasta.  1 cup fresh fruit.   cup canned fruit.  1 cup milk (whole, 2%, or skim).   cup starchy vegetables (peas, corn, or potatoes). Counting carbohydrates this way is similar to looking on the Nutrition  Facts panel. Decide how many servings you will eat first. Multiply the number of servings you eat by 15 g. For example, if you have 2 cups of strawberries, you had 2 servings. That means you had 30 g of carbohydrate (2 servings x 15 g = 30 g). CALCULATING CARBOHYDRATES IN A MEAL Sample dinner  3 oz chicken breast.   cup brown rice.   cup corn.  1 cup fat-free milk.  1 cup strawberries with sugar-free whipped topping. Carbohydrate calculation First, identify the foods that contain carbohydrate:  Rice.  Corn.  Milk.  Strawberries. Calculate the number of servings eaten:  2 servings rice.  1 serving corn.  1 serving milk.  1 serving strawberries. Multiply the number of servings by 15 g:  2 servings rice x 15 g = 30 g.  1 serving corn x 15 g = 15 g.  1 serving milk x 15 g = 15 g.  1 serving strawberries x 15 g = 15 g. Add the amounts to find the total carbohydrates eaten: 30 g + 15 g + 15 g + 15 g = 75 g carbohydrate eaten at dinner. Document Released: 11/09/2005 Document Revised: 02/01/2012 Document Reviewed: 09/25/2011 [facility] Patient Information 2014 [place], [place].

## 2013-10-23 NOTE — Progress Notes (Signed)
Patient ID: [name], female   DOB: 1941/03/10, 72 y.o.   MRN: [id number] [name] [id number] [birth year] 10/23/2013      Progress Note-Follow Up  Subjective  Chief Complaint  Chief Complaint  Patient presents with  . Annual Exam    physical    HPI  Patient is a 72 year old Caucasian female who is in today for annual exam. Generally doing well. No significant recent illness but is struggling with worsening cystic acne. Has chronic back pain but this has not worsened. Denies any chest pain, palpitations or shortness of breath. Reflux and gastritis improve her medications although she does still have symptoms at times. Denies polyuria or polydipsia. Doing well with her ADLs at home.  Past Medical History  Diagnosis Date  . ANEMIA 08/23/2008  . BACK PAIN, LUMBAR, CHRONIC 02/18/2010  . CORONARY ARTERY DISEASE 08/23/2008  . GERD 08/23/2008  . HYPERLIPIDEMIA 08/23/2008  . HYPOTHYROIDISM 08/23/2008  . OSTEOPENIA 08/23/2008  . Arthritis 11/25/2011  . Cerumen impaction 11/25/2011  . Preventative health care 08/05/2012    [facility] [place], had a recent colonoscopy she reports being told she did not need another test for 10 years Sees dermatologist, Dr Octavia Heir  . Hyperglycemia 08/05/2012  . HTN (hypertension) 09/21/2012  . Anxiety 09/21/2012  . Cystic acne 11/04/2012  . Ganglion cyst 11/04/2012    Right forearm and left wrist.  . Kidney stone 08/12/2013    Past Surgical History  Procedure Laterality Date  . Angioplasty  1991 & 1996  . Coronary stent placement  08/2010  . Cholecystectomy      Family History  Problem Relation Age of Onset  . Dementia Mother   . Arthritis Mother     rheumatoid  . Heart disease Mother     chf  . Osteoporosis Mother     broken hip  . Heart disease Father     massive MI  . Heart attack Father   . Hyperlipidemia Sister   . Hyperlipidemia Brother   . Cancer Maternal Aunt     breast  . Heart disease Daughter      2 MIs, Pulmonic Valve disease  . Hyperlipidemia Daughter   . Hyperlipidemia Son   . Hypertension Son   . Cancer Maternal Grandmother     gyn  . Heart disease Maternal Grandmother     CHF  . Alcohol abuse Paternal Grandfather   . Dementia Paternal Grandfather   . Cancer Brother     brain tumor    History   Social History  . Marital Status: Married    Spouse Name: N/A    Number of Children: N/A  . Years of Education: N/A   Occupational History  . Not on file.   Social History Main Topics  . Smoking status: Former Smoker    Quit date: 03/02/2009  . Smokeless tobacco: Not on file  . Alcohol Use: Yes  . Drug Use: No  . Sexual Activity: Not on file   Other Topics Concern  . Not on file   Social History Narrative  . No narrative on file    Current Outpatient Prescriptions on File Prior to Visit  Medication Sig Dispense Refill  . acyclovir (ZOVIRAX) 400 MG tablet TAKE 1 TABLET (400 MG TOTAL) BY MOUTH 2 (TWO) TIMES DAILY.  60 tablet  5  . ALPRAZolam (XANAX) 0.5 MG tablet Take 0.5 tablets (0.25 mg total) by mouth 2 (two) times daily as needed for sleep  or anxiety.  60 tablet  2  . aspirin 81 MG tablet Take 81 mg by mouth daily.        Marland Kitchen atenolol (TENORMIN) 50 MG tablet TAKE 1 TABLET (50 MG TOTAL) BY MOUTH DAILY.  90 tablet  1  . diclofenac (VOLTAREN) 75 MG EC tablet TAKE ONE TABLET BY MOUTH 2 TIMES DAILY AFTER MEALS  60 tablet  2  . diltiazem (CARDIZEM CD) 240 MG 24 hr capsule Take 1 capsule (240 mg total) by mouth daily.  90 capsule  3  . famotidine (PEPCID) 20 MG tablet Take 1 tablet (20 mg total) by mouth 2 (two) times daily.  60 tablet  1  . fish oil-omega-3 fatty acids 1000 MG capsule Take 2 g by mouth daily.        Marland Kitchen HYDROcodone-acetaminophen (NORCO) 10-325 MG per tablet Take 1 tablet by mouth every 8 (eight) hours as needed for pain.  180 tablet  1  . Krill Oil CAPS Take 1 capsule by mouth 1 day or 1 dose.      Marland Kitchen lisinopril (PRINIVIL,ZESTRIL) 5 MG tablet TAKE 1  TABLET (5 MG TOTAL) BY MOUTH DAILY.  90 tablet  1  . PARoxetine (PAXIL) 10 MG tablet Take 1 tablet (10 mg total) by mouth every morning.  30 tablet  3  . pravastatin (PRAVACHOL) 40 MG tablet TAKE ONE TABLET BY MOUTH DAILY  90 tablet  1  . prednisoLONE acetate (PRED FORTE) 1 % ophthalmic suspension Place 1 drop into the right eye daily as needed.  5 mL  1  . ranitidine (ZANTAC) 150 MG capsule Take by mouth 2 (two) times daily.      . rosuvastatin (CRESTOR) 40 MG tablet Take 1 tablet (40 mg total) by mouth daily.  30 tablet  3  . sucralfate (CARAFATE) 1 G tablet Take 1 tablet (1 g total) by mouth 4 (four) times daily.  120 tablet  3   No current facility-administered medications on file prior to visit.    No Known Allergies  Review of Systems  Review of Systems  Constitutional: Negative for fever and malaise/fatigue.  HENT: Negative for congestion.   Eyes: Negative for discharge.  Respiratory: Negative for shortness of breath.   Cardiovascular: Negative for chest pain, palpitations and leg swelling.  Gastrointestinal: Negative for nausea, abdominal pain and diarrhea.  Genitourinary: Positive for urgency. Negative for dysuria, frequency, hematuria and flank pain.  Musculoskeletal: Positive for joint pain. Negative for falls.  Skin: Negative for rash.  Neurological: Negative for loss of consciousness and headaches.  Endo/Heme/Allergies: Negative for polydipsia.  Psychiatric/Behavioral: Negative for depression and suicidal ideas. The patient is not nervous/anxious and does not have insomnia.     Objective  BP 122/72  Pulse 55  Temp(Src) 97.7 F (36.5 C) (Oral)  Ht 5\' 3"  (1.6 m)  Wt 188 lb 0.6 oz (85.294 kg)  BMI 33.32 kg/m2  SpO2 96%  LMP 11/23/1993  Physical Exam  Physical Exam  Constitutional: She is oriented to person, place, and time and well-developed, well-nourished, and in no distress. No distress.  HENT:  Head: Normocephalic and atraumatic.  Eyes: Conjunctivae are  normal.  Neck: Neck supple. No thyromegaly present.  Cardiovascular: Normal rate, regular rhythm and normal heart sounds.   No murmur heard. Pulmonary/Chest: Effort normal and breath sounds normal. She has no wheezes.  Abdominal: Soft. Bowel sounds are normal. She exhibits no distension and no mass.  Musculoskeletal: Normal range of motion. She exhibits no edema.  Lymphadenopathy:    She has no cervical adenopathy.  Neurological: She is alert and oriented to person, place, and time.  Skin: Skin is warm and dry. No rash noted. She is not diaphoretic.  Psychiatric: Memory, affect and judgment normal.    Lab Results  Component Value Date   TSH 1.146 10/16/2013   Lab Results  Component Value Date   WBC 6.3 10/16/2013   HGB 12.5 10/16/2013   HCT 37.1 10/16/2013   MCV 89.4 10/16/2013   PLT 269 10/16/2013   Lab Results  Component Value Date   CREATININE 0.58 10/16/2013   BUN 18 10/16/2013   NA 139 10/16/2013   K 4.0 10/16/2013   CL 103 10/16/2013   CO2 27 10/16/2013   Lab Results  Component Value Date   ALT 16 10/16/2013   AST 17 10/16/2013   ALKPHOS 109 10/16/2013   BILITOT 0.5 10/16/2013   Lab Results  Component Value Date   CHOL 171 10/16/2013   Lab Results  Component Value Date   HDL 35* 10/16/2013   Lab Results  Component Value Date   LDLCALC 115* 10/16/2013   Lab Results  Component Value Date   TRIG 104 10/16/2013   Lab Results  Component Value Date   CHOLHDL 4.9 10/16/2013     Assessment & Plan  HTN (hypertension) Well controlled no changes  CORONARY ARTERY DISEASE Asymptomatic at present  GERD Encouraged daily probiotics, medication as needed. Avoid offending foods  Cystic acne Minocycline had been working but hs been having more trouble again. Will increase strength and then change as needed  HYPERLIPIDEMIA Tolerating Pravastatin with adequate results  Hyperglycemia Last hgba1c 5.8 minimize simple carbs and increase  exercise.

## 2013-10-25 NOTE — Assessment & Plan Note (Signed)
Encouraged daily probiotics, medication as needed. Avoid offending foods

## 2013-10-25 NOTE — Assessment & Plan Note (Signed)
UTD on immunizations. Reviewed labs with patient, encouraged regular exercise and hearthealthy diet. Encouraged to develop and supply [place] with advanced directives. No concerns regarding ADLs, depression, hearing or vision concerns.

## 2013-10-25 NOTE — Assessment & Plan Note (Signed)
Last hgba1c 5.8 minimize simple carbs and increase exercise.

## 2013-10-25 NOTE — Assessment & Plan Note (Signed)
Asymptomatic at present.

## 2013-10-25 NOTE — Assessment & Plan Note (Signed)
Well controlled no changes 

## 2013-10-25 NOTE — Assessment & Plan Note (Signed)
Tolerating Pravastatin with adequate results

## 2013-10-25 NOTE — Assessment & Plan Note (Signed)
Minocycline had been working but hs been having more trouble again. Will increase strength and then change as needed

## 2013-11-21 ENCOUNTER — Telehealth: Payer: Self-pay

## 2013-11-21 NOTE — Telephone Encounter (Signed)
Left a message for pt to return my call. 

## 2013-11-21 NOTE — Telephone Encounter (Signed)
Ask her when she last got the Nitrostat and who gave it how often does she have to use. Can have Nitrostat 0.4 mg #25 1 tab po q 5 min prn cp, max of 3 tabs if no relief to ED

## 2013-11-21 NOTE — Telephone Encounter (Signed)
We received a fax from pharmacy requesting Nitrostat 0.4 mg #25?  Please advise? I don't see this on pts list

## 2013-11-22 MED ORDER — NITROGLYCERIN 0.4 MG SL SUBL: 0.4000 mg | SUBLINGUAL_TABLET | SUBLINGUAL | Status: AC | PRN

## 2013-11-22 NOTE — Telephone Encounter (Signed)
Rx to pharmacy/SLS 

## 2013-12-05 ENCOUNTER — Other Ambulatory Visit: Payer: Self-pay | Admitting: Family Medicine

## 2013-12-05 NOTE — Telephone Encounter (Signed)
Please advise refill?  Last RX was done on 08-08-13 quantity 30 with 3 refills  If ok fax to [phone]

## 2014-01-05 ENCOUNTER — Telehealth: Payer: Self-pay | Admitting: Family Medicine

## 2014-01-05 DIAGNOSIS — I10 Essential (primary) hypertension: Secondary | ICD-10-CM

## 2014-01-05 DIAGNOSIS — F419 Anxiety disorder, unspecified: Secondary | ICD-10-CM

## 2014-01-05 DIAGNOSIS — B9789 Other viral agents as the cause of diseases classified elsewhere: Secondary | ICD-10-CM

## 2014-01-05 DIAGNOSIS — L700 Acne vulgaris: Secondary | ICD-10-CM

## 2014-01-05 MED ORDER — LISINOPRIL 5 MG PO TABS: ORAL_TABLET | ORAL | Status: DC

## 2014-01-05 MED ORDER — MINOCYCLINE HCL 50 MG PO CAPS: 100.0000 mg | ORAL_CAPSULE | Freq: Two times a day (BID) | ORAL | Status: DC

## 2014-01-05 MED ORDER — ALPRAZOLAM 0.5 MG PO TABS: 0.2500 mg | ORAL_TABLET | Freq: Two times a day (BID) | ORAL | Status: DC | PRN

## 2014-01-05 MED ORDER — DILTIAZEM HCL ER COATED BEADS 240 MG PO CP24: 240.0000 mg | ORAL_CAPSULE | Freq: Every day | ORAL | Status: DC

## 2014-01-05 MED ORDER — ACYCLOVIR 400 MG PO TABS: ORAL_TABLET | ORAL | Status: DC

## 2014-01-05 MED ORDER — ATENOLOL 50 MG PO TABS: ORAL_TABLET | ORAL | Status: DC

## 2014-01-05 NOTE — Telephone Encounter (Signed)
New rx request  Atenolol  Diltiazem  Lisinopril  Alprazolam  Minocycline  acyclovir

## 2014-01-05 NOTE — Telephone Encounter (Signed)
All Rx request, excluding Alprazolam, sent to [facility] mail order pharmacy per escribe; Alprazolam #90x0 faxed to pharmacy/SLS

## 2014-01-08 ENCOUNTER — Telehealth: Payer: Self-pay

## 2014-01-08 NOTE — Telephone Encounter (Signed)
Pt called in stating that her insurance is saying they will cover 120 mg tablet, 30 mg or 60 mg. The 240 mg capsule is 100 plus a month.  Can pt do 2 120 mg tablets daily?   Please advise?

## 2014-01-09 NOTE — Telephone Encounter (Signed)
So there are lots of different versions of Diltiazem so I am not sure which one is the cheapest for her. OK to switch to 120 mg po bid, disp Disp #60, she will need an appt for bp check in roughly 2 weeks after starting the new pills to assess response

## 2014-01-10 NOTE — Telephone Encounter (Signed)
Pt was taking Diltiazem XR do you still want the 120 mg to be XR?

## 2014-01-10 NOTE — Telephone Encounter (Signed)
I think that is unclear. I am OK with either short acting or one of the many extended release version (ie CD) etc. I think you are going to have the patient clarify with her pharmacist which version is the best price on her plan and then I can adjust accordingly

## 2014-01-15 ENCOUNTER — Telehealth: Payer: Self-pay | Admitting: Family Medicine

## 2014-01-15 MED ORDER — PRAVASTATIN SODIUM 40 MG PO TABS: 40.0000 mg | ORAL_TABLET | Freq: Every day | ORAL | Status: DC

## 2014-01-15 NOTE — Telephone Encounter (Signed)
Refill- pravastatin

## 2014-01-15 NOTE — Telephone Encounter (Signed)
Refill- verapamil

## 2014-01-15 NOTE — Telephone Encounter (Signed)
I agree our chart says Diltiazem, we need to clarify with patient which one she is taking, cannot take both

## 2014-01-15 NOTE — Telephone Encounter (Signed)
Please advise? I don't see where patient has ever taken Verapamil?

## 2014-01-16 NOTE — Telephone Encounter (Signed)
Pt would like to switch to Verapamil because its cheaper? Its only 16 a month compared to 100 something.  Please advise?

## 2014-01-16 NOTE — Telephone Encounter (Signed)
I tried to call patient twice and the phone is busy

## 2014-01-16 NOTE — Telephone Encounter (Signed)
OK to switch to Verapamil but need to know if she wants the once daily (ER) version or the immediate release version which has to be taken 3 times a day or the twice daily ER version. The ER versions might cost more. We also could try switching her Diltiazem to the short acting version because that will likely be cheaper but has to be taken 4 x daily. She needs to check with her insurance company or her pharmacist and she needs to tell us if she is willing to take a med 3 x a day

## 2014-01-19 NOTE — Telephone Encounter (Signed)
Notified pt and she states she has been on diltizaem once a day and that is what has been too expensive. She is agreeable to change to verapamil even if she has to take it multiple times a day.  Please advise.

## 2014-01-21 NOTE — Telephone Encounter (Signed)
OK she can switch to Verapamil 40 mg tid and then check bp in 1-2 weeks. Disp #90 locally till we make sure this is the right amount

## 2014-01-23 MED ORDER — VERAPAMIL HCL 40 MG PO TABS: 40.0000 mg | ORAL_TABLET | Freq: Three times a day (TID) | ORAL | Status: DC

## 2014-01-23 NOTE — Telephone Encounter (Signed)
Patient informed and call transferred to [facility] to schedule nurse visit. RX sent to pharmacy

## 2014-02-02 ENCOUNTER — Other Ambulatory Visit: Payer: Self-pay

## 2014-02-02 DIAGNOSIS — R52 Pain, unspecified: Secondary | ICD-10-CM

## 2014-02-02 MED ORDER — HYDROCODONE-ACETAMINOPHEN 10-325 MG PO TABS: 1.0000 | ORAL_TABLET | Freq: Three times a day (TID) | ORAL | Status: DC | PRN

## 2014-02-02 NOTE — Telephone Encounter (Signed)
Informed patient that we cannot mail controlled substance rx's and that rx as ready for pick up at the front desk. She states that she will pickup prescription at the beginning of next week.

## 2014-02-02 NOTE — Telephone Encounter (Signed)
Please inform pt that she can come by to pick up this RX and that we can't mail controlled substance RX's

## 2014-02-02 NOTE — Telephone Encounter (Signed)
Pt left a message stating she would like a refill of her hydrocodone. Pt would like it mailed to her.  I will inform pt we can't mail control substances RX's.  Please advise refill?  Last RX was done on 08-08-13 quantity 180 with 1 refill

## 2014-02-09 ENCOUNTER — Other Ambulatory Visit: Payer: Self-pay

## 2014-02-09 DIAGNOSIS — Z1231 Encounter for screening mammogram for malignant neoplasm of breast: Secondary | ICD-10-CM

## 2014-02-13 ENCOUNTER — Ambulatory Visit (INDEPENDENT_AMBULATORY_CARE_PROVIDER_SITE_OTHER): Payer: Medicare Other

## 2014-02-13 VITALS — BP 138/90 | HR 69

## 2014-02-13 DIAGNOSIS — I10 Essential (primary) hypertension: Secondary | ICD-10-CM

## 2014-02-13 LAB — CBC
HCT: 38.3 % (ref 36.0–46.0)
Hemoglobin: 13.1 g/dL (ref 12.0–15.0)
MCH: 30.3 pg (ref 26.0–34.0)
MCHC: 34.2 g/dL (ref 30.0–36.0)
MCV: 88.5 fL (ref 78.0–100.0)
Platelets: 263 10*3/uL (ref 150–400)
RBC: 4.33 MIL/uL (ref 3.87–5.11)
RDW: 14.8 % (ref 11.5–15.5)
WBC: 5.5 10*3/uL (ref 4.0–10.5)

## 2014-02-13 LAB — RENAL FUNCTION PANEL
Albumin: 4.2 g/dL (ref 3.5–5.2)
BUN: 20 mg/dL (ref 6–23)
CO2: 25 mEq/L (ref 19–32)
Calcium: 9.7 mg/dL (ref 8.4–10.5)
Chloride: 106 mEq/L (ref 96–112)
Creat: 0.45 mg/dL — ABNORMAL LOW (ref 0.50–1.10)
Glucose, Bld: 100 mg/dL — ABNORMAL HIGH (ref 70–99)
Phosphorus: 3.6 mg/dL (ref 2.3–4.6)
Potassium: 4.1 mEq/L (ref 3.5–5.3)
Sodium: 140 mEq/L (ref 135–145)

## 2014-02-13 LAB — LIPID PANEL
Cholesterol: 149 mg/dL (ref 0–200)
HDL: 36 mg/dL — ABNORMAL LOW (ref >39)
LDL Cholesterol: 94 mg/dL (ref 0–99)
Total CHOL/HDL Ratio: 4.1 Ratio
Triglycerides: 95 mg/dL (ref <150)
VLDL: 19 mg/dL (ref 0–40)

## 2014-02-13 LAB — HEPATIC FUNCTION PANEL
ALT: 15 U/L (ref 0–35)
AST: 16 U/L (ref 0–37)
Albumin: 4.2 g/dL (ref 3.5–5.2)
Alkaline Phosphatase: 104 U/L (ref 39–117)
Bilirubin, Direct: 0.1 mg/dL (ref 0.0–0.3)
Indirect Bilirubin: 0.4 mg/dL (ref 0.2–1.2)
Total Bilirubin: 0.5 mg/dL (ref 0.2–1.2)
Total Protein: 6.8 g/dL (ref 6.0–8.3)

## 2014-02-13 LAB — RHEUMATOID FACTOR: Rhuematoid fact SerPl-aCnc: <10 IU/mL (ref <=14)

## 2014-02-13 LAB — HEMOGLOBIN A1C
Hgb A1c MFr Bld: 5.8 % — ABNORMAL HIGH (ref <5.7)
Mean Plasma Glucose: 120 mg/dL — ABNORMAL HIGH (ref <117)

## 2014-02-13 LAB — TSH: TSH: 0.581 u[IU]/mL (ref 0.350–4.500)

## 2014-02-14 LAB — CULTURE, URINE COMPREHENSIVE
Colony Count: NO GROWTH
Organism ID, Bacteria: NO GROWTH

## 2014-02-26 ENCOUNTER — Ambulatory Visit: Payer: Medicare Other | Admitting: Family Medicine

## 2014-03-13 ENCOUNTER — Ambulatory Visit: Payer: Medicare Other

## 2014-03-16 ENCOUNTER — Other Ambulatory Visit: Payer: Self-pay | Admitting: *Deleted

## 2014-03-16 MED ORDER — VERAPAMIL HCL 40 MG PO TABS: 40.0000 mg | ORAL_TABLET | Freq: Two times a day (BID) | ORAL | Status: DC

## 2014-03-16 NOTE — Telephone Encounter (Signed)
Rx request to pharmacy/[facility]  

## 2014-03-21 ENCOUNTER — Other Ambulatory Visit: Payer: Self-pay | Admitting: *Deleted

## 2014-03-21 MED ORDER — VERAPAMIL HCL 40 MG PO TABS: 40.0000 mg | ORAL_TABLET | Freq: Two times a day (BID) | ORAL | Status: DC

## 2014-04-17 ENCOUNTER — Telehealth: Payer: Self-pay | Admitting: Family Medicine

## 2014-04-17 DIAGNOSIS — I10 Essential (primary) hypertension: Secondary | ICD-10-CM

## 2014-04-17 MED ORDER — ATENOLOL 50 MG PO TABS: ORAL_TABLET | ORAL | Status: DC

## 2014-04-17 NOTE — Telephone Encounter (Signed)
Refill- atenolol  [facility] [phone] in [place]

## 2014-04-20 ENCOUNTER — Ambulatory Visit
Admission: RE | Admit: 2014-04-20 | Discharge: 2014-04-20 | Disposition: A | Discharge status: Home / Self Care | Payer: Medicare Other | Source: Ambulatory Visit

## 2014-04-20 DIAGNOSIS — Z1231 Encounter for screening mammogram for malignant neoplasm of breast: Secondary | ICD-10-CM

## 2014-04-23 ENCOUNTER — Telehealth: Payer: Self-pay | Admitting: Family Medicine

## 2014-04-23 MED ORDER — PRAVASTATIN SODIUM 40 MG PO TABS: 40.0000 mg | ORAL_TABLET | Freq: Every day | ORAL | Status: DC

## 2014-04-23 NOTE — Telephone Encounter (Signed)
Refill- pravastatin  [facility]

## 2014-05-03 ENCOUNTER — Ambulatory Visit: Payer: Medicare Other | Admitting: Family Medicine

## 2014-05-07 ENCOUNTER — Telehealth: Payer: Self-pay | Admitting: Family Medicine

## 2014-05-07 DIAGNOSIS — I10 Essential (primary) hypertension: Secondary | ICD-10-CM

## 2014-05-07 NOTE — Telephone Encounter (Signed)
Refill-lisinopril  [facility] [phone] in [place]

## 2014-05-08 MED ORDER — ATENOLOL 50 MG PO TABS: ORAL_TABLET | ORAL | Status: DC

## 2014-05-08 NOTE — Telephone Encounter (Signed)
Please inform pt that an appt must be made within the next 3 months to continue getting refills. Last 2 appts have been no showed or cancelled

## 2014-05-08 NOTE — Telephone Encounter (Signed)
Left detailed message informing patient of medication refill and that she needs to be seen within the next 3 months before further refills can be given.

## 2014-05-11 ENCOUNTER — Telehealth: Payer: Self-pay | Admitting: Family Medicine

## 2014-05-11 NOTE — Telephone Encounter (Signed)
pharmacist left message requesting refill on Lisinopril 90 day supply called to [place]

## 2014-05-14 ENCOUNTER — Ambulatory Visit: Payer: Medicare Other | Admitting: Physician Assistant

## 2014-05-14 MED ORDER — LISINOPRIL 5 MG PO TABS: ORAL_TABLET | ORAL | Status: DC

## 2014-05-14 NOTE — Telephone Encounter (Signed)
Rx request to pharmacy/[facility]  

## 2014-06-23 ENCOUNTER — Encounter: Payer: Self-pay | Admitting: Family Medicine

## 2014-06-23 ENCOUNTER — Ambulatory Visit (INDEPENDENT_AMBULATORY_CARE_PROVIDER_SITE_OTHER): Payer: Medicare Other | Admitting: Family Medicine

## 2014-06-23 VITALS — BP 134/78 | HR 55 | Temp 98.2°F | Resp 16

## 2014-06-23 DIAGNOSIS — K2100 Gastro-esophageal reflux disease with esophagitis, without bleeding: Secondary | ICD-10-CM

## 2014-06-23 DIAGNOSIS — E039 Hypothyroidism, unspecified: Secondary | ICD-10-CM

## 2014-06-23 DIAGNOSIS — F411 Generalized anxiety disorder: Secondary | ICD-10-CM

## 2014-06-23 DIAGNOSIS — E785 Hyperlipidemia, unspecified: Secondary | ICD-10-CM

## 2014-06-23 DIAGNOSIS — I10 Essential (primary) hypertension: Secondary | ICD-10-CM

## 2014-06-23 DIAGNOSIS — F419 Anxiety disorder, unspecified: Secondary | ICD-10-CM

## 2014-06-23 MED ORDER — LISINOPRIL 10 MG PO TABS: 10.0000 mg | ORAL_TABLET | Freq: Two times a day (BID) | ORAL | Status: DC

## 2014-06-23 MED ORDER — SUCRALFATE 1 G PO TABS: 1.0000 g | ORAL_TABLET | Freq: Four times a day (QID) | ORAL | Status: DC

## 2014-06-23 MED ORDER — ALPRAZOLAM 0.5 MG PO TABS: 0.2500 mg | ORAL_TABLET | Freq: Two times a day (BID) | ORAL | Status: DC | PRN

## 2014-06-23 NOTE — Assessment & Plan Note (Addendum)
Poorly controlled will alter medications, encouraged DASH diet, minimize caffeine and obtain adequate sleep. Report concerning symptoms and follow up as directed and as needed. Higher on recheck, increase Lisinopril to 10 mg po bid and reassess in office in 6 weeks or as needed

## 2014-06-23 NOTE — Assessment & Plan Note (Signed)
Stable on Paroxetine, takes Alprazolam infrequently, is allowed a refill today

## 2014-06-23 NOTE — Assessment & Plan Note (Signed)
On Levothyroxine, continue to monitor 

## 2014-06-23 NOTE — Progress Notes (Signed)
Pre visit review using our clinic review tool, if applicable. No additional management support is needed unless otherwise documented below in the visit note. 

## 2014-06-23 NOTE — Patient Instructions (Signed)

## 2014-06-23 NOTE — Assessment & Plan Note (Signed)
Refill given on Carafate does well most days does take a 1/2 carafate in am with good results, is given a refill today Avoid offending foods, start probiotics. Do not eat large meals in late evening and consider raising head of bed.

## 2014-06-23 NOTE — Assessment & Plan Note (Signed)
In a clinical trial at [facility] and gets shots in her legs for this regularly

## 2014-06-23 NOTE — Progress Notes (Signed)
Patient ID: [name], female   DOB: Feb 10, 1941, 73 y.o.   MRN: [id number] [name] [id number] 22-May-1941 06/23/2014      Progress Note-Follow Up  Subjective  Chief Complaint  Chief Complaint  Patient presents with  . Hypertension    C/O fluctuating BP for last month    HPI  Patient is a 73 year old female in today for routine medical care. She is noting recent hi BP ranging from systolic of 481 to 856 when seen elsewhere in the past few weeks. Notes an occasional mild HA but believes that is more related to her chronic neck pain, NO neurologic c/o. No photophobia, phonphobia. Denies CP/palp/SOB/HA/congestion/fevers GU c/o. Taking meds as prescribed. Does note intermittent trouble with heartburn and fullness in chest but is relieved with Carafate. No bloody or tarry stool No recent illness   Past Medical History  Diagnosis Date  . ANEMIA 08/23/2008  . BACK PAIN, LUMBAR, CHRONIC 02/18/2010  . CORONARY ARTERY DISEASE 08/23/2008  . GERD 08/23/2008  . HYPERLIPIDEMIA 08/23/2008  . HYPOTHYROIDISM 08/23/2008  . OSTEOPENIA 08/23/2008  . Arthritis 11/25/2011  . Cerumen impaction 11/25/2011  . Preventative health care 08/05/2012    [facility]ld[facility] [place], had a recent colonoscopy she reports being told she did not need another test for 10 years Sees dermatologist, Dr Vito Berger  . Hyperglycemia 08/05/2012  . HTN (hypertension) 09/21/2012  . Anxiety 09/21/2012  . Cystic acne 11/04/2012  . Ganglion cyst 11/04/2012    Right forearm and left wrist.  . Kidney stone 08/12/2013    Past Surgical History  Procedure Laterality Date  . Angioplasty  [place]  . Coronary stent placement  08/2010  . Cholecystectomy      Family History  Problem Relation Age of Onset  . Dementia Mother   . Arthritis Mother     rheumatoid  . Heart disease Mother     chf  . Osteoporosis Mother     broken hip  . Heart disease Father     massive MI  . Heart attack Father   .  Hyperlipidemia Sister   . Hyperlipidemia Brother   . Cancer Maternal Aunt     breast  . Heart disease Daughter     2 MIs, Pulmonic Valve disease  . Hyperlipidemia Daughter   . Hyperlipidemia Son   . Hypertension Son   . Cancer Maternal Grandmother     gyn  . Heart disease Maternal Grandmother     CHF  . Alcohol abuse Paternal Grandfather   . Dementia Paternal Grandfather   . Cancer Brother     brain tumor    History   Social History  . Marital Status: Married    Spouse Name: N/A    Number of Children: N/A  . Years of Education: N/A   Occupational History  . Not on file.   Social History Main Topics  . Smoking status: Former Smoker    Quit date: 03/02/2009  . Smokeless tobacco: Not on file  . Alcohol Use: Yes  . Drug Use: No  . Sexual Activity: Not on file   Other Topics Concern  . Not on file   Social History Narrative  . No narrative on file    Current Outpatient Prescriptions on File Prior to Visit  Medication Sig Dispense Refill  . acyclovir (ZOVIRAX) 400 MG tablet TAKE 1 TABLET (400 MG TOTAL) BY MOUTH 2 (TWO) TIMES DAILY.  180 tablet  1  .  aspirin 81 MG tablet Take 81 mg by mouth daily.        Marland Kitchen atenolol (TENORMIN) 50 MG tablet TAKE 1 TABLET (50 MG TOTAL) BY MOUTH DAILY.  90 tablet  0  . famotidine (PEPCID) 20 MG tablet Take 1 tablet (20 mg total) by mouth 2 (two) times daily.  60 tablet  1  . fish oil-omega-3 fatty acids 1000 MG capsule Take 2 g by mouth daily.        Marland Kitchen HYDROcodone-acetaminophen (NORCO) 10-325 MG per tablet Take 1 tablet by mouth every 8 (eight) hours as needed.  180 tablet  0  . Krill Oil CAPS Take 1 capsule by mouth 1 day or 1 dose.      . minocycline (MINOCIN,DYNACIN) 50 MG capsule Take 2 capsules (100 mg total) by mouth 2 (two) times daily.  360 capsule  1  . nitroGLYCERIN (NITROSTAT) 0.4 MG SL tablet Place 1 tablet (0.4 mg total) under the tongue every 5 (five) minutes as needed for chest pain.  25 tablet  1  . PARoxetine (PAXIL) 10  MG tablet TAKE 1 TABLET (10 MG TOTAL) BY MOUTH EVERY MORNING.  30 tablet  3  . pravastatin (PRAVACHOL) 40 MG tablet Take 1 tablet (40 mg total) by mouth daily.  90 tablet  0  . prednisoLONE acetate (PRED FORTE) 1 % ophthalmic suspension Place 1 drop into the right eye daily as needed.  5 mL  1  . ranitidine (ZANTAC) 150 MG capsule Take by mouth 2 (two) times daily.      . verapamil (CALAN) 40 MG tablet Take 1 tablet (40 mg total) by mouth 2 (two) times daily.  180 tablet  0   No current facility-administered medications on file prior to visit.    No Known Allergies  Review of Systems  Review of Systems  Constitutional: Negative for fever and malaise/fatigue.  HENT: Negative for congestion.   Eyes: Negative for discharge.  Respiratory: Negative for shortness of breath.   Cardiovascular: Negative for chest pain, palpitations and leg swelling.  Gastrointestinal: Positive for heartburn. Negative for nausea, abdominal pain and diarrhea.  Genitourinary: Negative for dysuria.  Musculoskeletal: Positive for neck pain. Negative for falls.  Skin: Negative for rash.  Neurological: Positive for headaches. Negative for loss of consciousness.  Endo/Heme/Allergies: Negative for polydipsia.  Psychiatric/Behavioral: Negative for depression and suicidal ideas. The patient is nervous/anxious. The patient does not have insomnia.     Objective  BP 134/78  Pulse 55  Temp(Src) 98.2 F (36.8 C) (Oral)  Resp 16  SpO2 97%  LMP 11/23/1993  Physical Exam  Physical Exam  Constitutional: She is oriented to person, place, and time and well-developed, well-nourished, and in no distress. No distress.  HENT:  Head: Normocephalic and atraumatic.  Eyes: Conjunctivae are normal.  Neck: Neck supple. No thyromegaly present.  Cardiovascular: Normal rate, regular rhythm and normal heart sounds.   No murmur heard. Pulmonary/Chest: Effort normal and breath sounds normal. She has no wheezes.  Abdominal: She  exhibits no distension and no mass.  Musculoskeletal: She exhibits no edema.  Lymphadenopathy:    She has no cervical adenopathy.  Neurological: She is alert and oriented to person, place, and time.  Skin: Skin is warm and dry. No rash noted. She is not diaphoretic.  Psychiatric: Memory, affect and judgment normal.    Lab Results  Component Value Date   TSH 0.581 02/13/2014   Lab Results  Component Value Date   WBC 5.5 02/13/2014  HGB 13.1 02/13/2014   HCT 38.3 02/13/2014   MCV 88.5 02/13/2014   PLT 263 02/13/2014   Lab Results  Component Value Date   CREATININE 0.45* 02/13/2014   BUN 20 02/13/2014   NA 140 02/13/2014   K 4.1 02/13/2014   CL 106 02/13/2014   CO2 25 02/13/2014   Lab Results  Component Value Date   ALT 15 02/13/2014   AST 16 02/13/2014   ALKPHOS 104 02/13/2014   BILITOT 0.5 02/13/2014   Lab Results  Component Value Date   CHOL 149 02/13/2014   Lab Results  Component Value Date   HDL 36* 02/13/2014   Lab Results  Component Value Date   LDLCALC 94 02/13/2014   Lab Results  Component Value Date   TRIG 95 02/13/2014   Lab Results  Component Value Date   CHOLHDL 4.1 02/13/2014     Assessment & Plan  HYPOTHYROIDISM On Levothyroxine, continue to monitor  HYPERLIPIDEMIA In a clinical trial at [facility] and gets shots in her legs for this regularly  HTN (hypertension) Poorly controlled will alter medications, encouraged DASH diet, minimize caffeine and obtain adequate sleep. Report concerning symptoms and follow up as directed and as needed. Higher on recheck, increase Lisinopril to 10 mg po bid and reassess in office in 6 weeks or as needed  GERD Refill given on Carafate does well most days does take a 1/2 carafate in am with good results, is given a refill today Avoid offending foods, start probiotics. Do not eat large meals in late evening and consider raising head of bed.   Anxiety Stable on Paroxetine, takes Alprazolam infrequently, is allowed a refill  today

## 2014-07-16 ENCOUNTER — Telehealth: Payer: Self-pay | Admitting: Family Medicine

## 2014-07-16 NOTE — Telephone Encounter (Signed)
Patient Information:  Caller Name: Hanan  Phone: [phone]  Patient: [name], [name]  Gender: Female  DOB: Apr 04, 1941  Age: 73 Years  PCP: Penni Homans [facility])  Office Follow Up:  Does the office need to follow up with this patient?: Yes  Instructions For The Office: see notes----pt requesting script  RN Note:  Pt c/o sinus pain/pressure and is requesting a prescription from MD. She states she prefers not to come into office. Assured her will send message and someone will call her back today to f/u.  Symptoms  Reason For Call & Symptoms: cold sxs: started with minor sore throat (which is now resolved) but now has nasal congestion/drainage and chest congestion. Only intermittent mild cough. Does have sinus pressure. Highest temp was 99.8.  Reviewed Health History In EMR: Yes  Reviewed Medications In EMR: Yes  Reviewed Allergies In EMR: Yes  Reviewed Surgeries / Procedures: Yes  Date of Onset of Symptoms: 07/12/2014  Treatments Tried: Alka-Seltzer severe cold; Equate cold med; Mucinex  Treatments Tried Worked: No  Guideline(s) Used:  Colds  Disposition Per Guideline:   Home Care  Reason For Disposition Reached:   Colds with no complications  Advice Given:  Reassurance  Colds are very common and may make you feel uncomfortable.  Colds are caused by viruses, and no medicine or "shot" will cure an uncomplicated cold.  Here is some care advice that should help.  For a Runny Nose With Profuse Discharge:   Nasal mucus and discharge helps to wash viruses and bacteria out of the nose and sinuses.  Blowing the nose is all that is needed.  For a Stuffy Nose - Use Nasal Washes:  Introduction: Saline (salt water) nasal irrigation (nasal wash) is an effective and simple home remedy for treating stuffy nose and sinus congestion. The nose can be irrigated by pouring, spraying, or squirting salt water into the nose and then letting it run back out.  How it Helps: The salt water  rinses out excess mucus, washes out any irritants (dust, allergens) that might be present, and moistens the nasal cavity.  Methods: There are several ways to perform nasal irrigation. You can use a saline nasal spray bottle (available over-the-counter), a rubber ear syringe, a medical syringe without the needle, or a Neti Pot.  Treatment for Associated Symptoms of Colds:  Sore throat: Try throat lozenges, hard candy, or warm chicken broth.  Hydrate: Drink adequate liquids.  Humidifier:  If the air in your home is dry, use a cool-mist humidifier  Expected Course:   Nasal discharge 7-14 days  Call Back If:  Difficulty breathing occurs  Nasal discharge lasts more than 10 days  You become worse  Patient Will Follow Care Advice:  YES

## 2014-07-16 NOTE — Telephone Encounter (Signed)
CAN Message: Office Follow Up:  Does the office need to follow up with this patient?: Yes  Instructions For The Office: see notes----pt requesting script  RN Note:  Pt c/o sinus pain/pressure and is requesting a prescription from MD. She states she prefers not to come into office. Assured her will send message and someone will call her back today to f/u.

## 2014-07-16 NOTE — Telephone Encounter (Signed)
Generally prefer not to prescribe antibiotics just for congestion. What other symptoms does she have. Good chance this is just viral. So recommend. Encouraged increased rest and hydration, add probiotics, zinc such as Coldeze or Xicam. Treat fevers as needed. If no improvement in a week or sooner if worse, come in for evaluation

## 2014-07-16 NOTE — Telephone Encounter (Signed)
Patient notified of Dr. Azalee Course recommendations.  Did not have any further questions and concerns.  No new symptoms to report.

## 2014-08-09 ENCOUNTER — Ambulatory Visit: Payer: Medicare Other | Admitting: Family Medicine

## 2014-09-06 ENCOUNTER — Other Ambulatory Visit: Payer: Self-pay | Admitting: Family Medicine

## 2014-10-30 ENCOUNTER — Other Ambulatory Visit: Payer: Self-pay

## 2014-10-30 MED ORDER — PRAVASTATIN SODIUM 40 MG PO TABS: 40.0000 mg | ORAL_TABLET | Freq: Every day | ORAL | Status: DC

## 2014-11-26 DIAGNOSIS — E785 Hyperlipidemia, unspecified: Secondary | ICD-10-CM | POA: Diagnosis not present

## 2014-11-26 DIAGNOSIS — I2510 Atherosclerotic heart disease of native coronary artery without angina pectoris: Secondary | ICD-10-CM | POA: Diagnosis not present

## 2014-11-26 DIAGNOSIS — I10 Essential (primary) hypertension: Secondary | ICD-10-CM | POA: Diagnosis not present

## 2014-11-26 DIAGNOSIS — I471 Supraventricular tachycardia: Secondary | ICD-10-CM | POA: Diagnosis not present

## 2015-01-24 ENCOUNTER — Other Ambulatory Visit: Payer: Self-pay | Admitting: Family Medicine

## 2015-02-20 ENCOUNTER — Other Ambulatory Visit: Payer: Self-pay | Admitting: Family Medicine

## 2015-03-22 ENCOUNTER — Ambulatory Visit: Payer: Self-pay | Admitting: Family Medicine

## 2015-04-16 ENCOUNTER — Other Ambulatory Visit: Payer: Self-pay

## 2015-04-16 DIAGNOSIS — Z1231 Encounter for screening mammogram for malignant neoplasm of breast: Secondary | ICD-10-CM

## 2015-04-23 ENCOUNTER — Ambulatory Visit (INDEPENDENT_AMBULATORY_CARE_PROVIDER_SITE_OTHER): Payer: Medicare Other | Admitting: Family Medicine

## 2015-04-23 ENCOUNTER — Encounter: Payer: Self-pay | Admitting: Family Medicine

## 2015-04-23 VITALS — BP 122/72 | HR 60 | Temp 98.3°F | Ht 63.0 in | Wt 190.0 lb

## 2015-04-23 DIAGNOSIS — F419 Anxiety disorder, unspecified: Secondary | ICD-10-CM

## 2015-04-23 DIAGNOSIS — M8580 Other specified disorders of bone density and structure, unspecified site: Secondary | ICD-10-CM | POA: Diagnosis not present

## 2015-04-23 DIAGNOSIS — R739 Hyperglycemia, unspecified: Secondary | ICD-10-CM

## 2015-04-23 DIAGNOSIS — I10 Essential (primary) hypertension: Secondary | ICD-10-CM | POA: Diagnosis not present

## 2015-04-23 DIAGNOSIS — E559 Vitamin D deficiency, unspecified: Secondary | ICD-10-CM

## 2015-04-23 DIAGNOSIS — E782 Mixed hyperlipidemia: Secondary | ICD-10-CM | POA: Diagnosis not present

## 2015-04-23 DIAGNOSIS — E669 Obesity, unspecified: Secondary | ICD-10-CM

## 2015-04-23 DIAGNOSIS — M1990 Unspecified osteoarthritis, unspecified site: Secondary | ICD-10-CM

## 2015-04-23 MED ORDER — LISINOPRIL 10 MG PO TABS: ORAL_TABLET | ORAL | Status: DC

## 2015-04-23 MED ORDER — PRAVASTATIN SODIUM 40 MG PO TABS: ORAL_TABLET | ORAL | Status: DC

## 2015-04-23 MED ORDER — VERAPAMIL HCL 40 MG PO TABS: 40.0000 mg | ORAL_TABLET | Freq: Every day | ORAL | Status: DC

## 2015-04-23 MED ORDER — ATENOLOL 50 MG PO TABS: ORAL_TABLET | ORAL | Status: DC

## 2015-04-23 MED ORDER — ALPRAZOLAM 0.5 MG PO TABS: 0.2500 mg | ORAL_TABLET | Freq: Two times a day (BID) | ORAL | Status: DC | PRN

## 2015-04-23 NOTE — Progress Notes (Signed)
Pre visit review using our clinic review tool, if applicable. No additional management support is needed unless otherwise documented below in the visit note. 

## 2015-04-23 NOTE — Patient Instructions (Addendum)
Check BP in evening about 1 hour after Lisinopril dose if >135/90 then add back second dose of Verapamil  Hypertension Hypertension, commonly called high blood pressure, is when the force of blood pumping through your arteries is too strong. Your arteries are the blood vessels that carry blood from your heart throughout your body. A blood pressure reading consists of a higher number over a lower number, such as 110/72. The higher number (systolic) is the pressure inside your arteries when your heart pumps. The lower number (diastolic) is the pressure inside your arteries when your heart relaxes. Ideally you want your blood pressure below 120/80. Hypertension forces your heart to work harder to pump blood. Your arteries may become narrow or stiff. Having hypertension puts you at risk for heart disease, stroke, and other problems.  RISK FACTORS Some risk factors for high blood pressure are controllable. Others are not.  Risk factors you cannot control include:   Race. You may be at higher risk if you are African American.  Age. Risk increases with age.  Gender. Men are at higher risk than women before age 47 years. After age 15, women are at higher risk than men. Risk factors you can control include:  Not getting enough exercise or physical activity.  Being overweight.  Getting too much fat, sugar, calories, or salt in your diet.  Drinking too much alcohol. SIGNS AND SYMPTOMS Hypertension does not usually cause signs or symptoms. Extremely high blood pressure (hypertensive crisis) may cause headache, anxiety, shortness of breath, and nosebleed. DIAGNOSIS  To check if you have hypertension, your health care provider will measure your blood pressure while you are seated, with your arm held at the level of your heart. It should be measured at least twice using the same arm. Certain conditions can cause a difference in blood pressure between your right and left arms. A blood pressure reading that  is higher than normal on one occasion does not mean that you need treatment. If one blood pressure reading is high, ask your health care provider about having it checked again. TREATMENT  Treating high blood pressure includes making lifestyle changes and possibly taking medicine. Living a healthy lifestyle can help lower high blood pressure. You may need to change some of your habits. Lifestyle changes may include:  Following the DASH diet. This diet is high in fruits, vegetables, and whole grains. It is low in salt, red meat, and added sugars.  Getting at least 2 hours of brisk physical activity every week.  Losing weight if necessary.  Not smoking.  Limiting alcoholic beverages.  Learning ways to reduce stress. If lifestyle changes are not enough to get your blood pressure under control, your health care provider may prescribe medicine. You may need to take more than one. Work closely with your health care provider to understand the risks and benefits. HOME CARE INSTRUCTIONS  Have your blood pressure rechecked as directed by your health care provider.   Take medicines only as directed by your health care provider. Follow the directions carefully. Blood pressure medicines must be taken as prescribed. The medicine does not work as well when you skip doses. Skipping doses also puts you at risk for problems.   Do not smoke.   Monitor your blood pressure at home as directed by your health care provider. SEEK MEDICAL CARE IF:   You think you are having a reaction to medicines taken.  You have recurrent headaches or feel dizzy.  You have swelling in your ankles.  You have trouble with your vision. SEEK IMMEDIATE MEDICAL CARE IF:  You develop a severe headache or confusion.  You have unusual weakness, numbness, or feel faint.  You have severe chest or abdominal pain.  You vomit repeatedly.  You have trouble breathing. MAKE SURE YOU:   Understand these  instructions.  Will watch your condition.  Will get help right away if you are not doing well or get worse. Document Released: 11/09/2005 Document Revised: 03/26/2014 Document Reviewed: 09/01/2013 [facility] Patient Information 2015 [place], [place]. This information is not intended to replace advice given to you by your health care provider. Make sure you discuss any questions you have with your health care provider. Try to massage foot with Aspercreme twice daily Can use on hands.   Ganglion Cyst A ganglion cyst is a noncancerous, fluid-filled lump that occurs near joints or tendons. The ganglion cyst grows out of a joint or the lining of a tendon. It most often develops in the hand or wrist but can also develop in the shoulder, elbow, hip, knee, ankle, or foot. The round or oval ganglion can be pea sized or larger than a grape. Increased activity may enlarge the size of the cyst because more fluid starts to build up.  CAUSES  It is not completely known what causes a ganglion cyst to grow. However, it may be related to:  Inflammation or irritation around the joint.  An injury.  Repetitive movements or overuse.  Arthritis. SYMPTOMS  A lump most often appears in the hand or wrist, but can occur in other areas of the body. Generally, the lump is painless without other symptoms. However, sometimes pain can be felt during activity or when pressure is applied to the lump. The lump may even be tender to the touch. Tingling, pain, numbness, or muscle weakness can occur if the ganglion cyst presses on a nerve. Your grip may be weak and you may have less movement in your joints.  DIAGNOSIS  Ganglion cysts are most often diagnosed based on a physical exam, noting where the cyst is and how it looks. Your caregiver will feel the lump and may shine a light alongside it. If it is a ganglion, a light often shines through it. Your caregiver may order an X-ray, ultrasound, or MRI to rule out other  conditions. TREATMENT  Ganglions usually go away on their own without treatment. If pain or other symptoms are involved, treatment may be needed. Treatment is also needed if the ganglion limits your movement or if it gets infected. Treatment options include:  Wearing a wrist or finger brace or splint.  Taking anti-inflammatory medicine.  Draining fluid from the lump with a needle (aspiration).  Injecting a steroid into the joint.  Surgery to remove the ganglion cyst and its stalk that is attached to the joint or tendon. However, ganglion cysts can grow back. HOME CARE INSTRUCTIONS   Do not press on the ganglion, poke it with a needle, or hit it with a heavy object. You may rub the lump gently and often. Sometimes fluid moves out of the cyst.  Only take medicines as directed by your caregiver.  Wear your brace or splint as directed by your caregiver. SEEK MEDICAL CARE IF:   Your ganglion becomes larger or more painful.  You have increased redness, red streaks, or swelling.  You have pus coming from the lump.  You have weakness or numbness in the affected area. MAKE SURE YOU:   Understand these instructions.  Will  watch your condition.  Will get help right away if you are not doing well or get worse. Document Released: 11/06/2000 Document Revised: 08/03/2012 Document Reviewed: 01/03/2008 [facility] Patient Information 2015 [place], [place]. This information is not intended to replace advice given to you by your health care provider. Make sure you discuss any questions you have with your health care provider.

## 2015-04-24 ENCOUNTER — Other Ambulatory Visit (INDEPENDENT_AMBULATORY_CARE_PROVIDER_SITE_OTHER): Payer: Medicare Other

## 2015-04-24 ENCOUNTER — Ambulatory Visit
Admission: RE | Admit: 2015-04-24 | Discharge: 2015-04-24 | Disposition: A | Discharge status: Home / Self Care | Payer: Medicare Other | Source: Ambulatory Visit

## 2015-04-24 DIAGNOSIS — F419 Anxiety disorder, unspecified: Secondary | ICD-10-CM

## 2015-04-24 DIAGNOSIS — R739 Hyperglycemia, unspecified: Secondary | ICD-10-CM

## 2015-04-24 DIAGNOSIS — Z1231 Encounter for screening mammogram for malignant neoplasm of breast: Secondary | ICD-10-CM | POA: Diagnosis not present

## 2015-04-24 DIAGNOSIS — M8580 Other specified disorders of bone density and structure, unspecified site: Secondary | ICD-10-CM

## 2015-04-24 DIAGNOSIS — I10 Essential (primary) hypertension: Secondary | ICD-10-CM | POA: Diagnosis not present

## 2015-04-24 DIAGNOSIS — M859 Disorder of bone density and structure, unspecified: Secondary | ICD-10-CM

## 2015-04-24 DIAGNOSIS — E782 Mixed hyperlipidemia: Secondary | ICD-10-CM | POA: Diagnosis not present

## 2015-04-24 LAB — COMPREHENSIVE METABOLIC PANEL
ALT: 19 U/L (ref 0–35)
AST: 21 U/L (ref 0–37)
Albumin: 4.1 g/dL (ref 3.5–5.2)
Alkaline Phosphatase: 89 U/L (ref 39–117)
BUN: 16 mg/dL (ref 6–23)
CO2: 27 mEq/L (ref 19–32)
Calcium: 9.5 mg/dL (ref 8.4–10.5)
Chloride: 105 mEq/L (ref 96–112)
Creatinine, Ser: 0.52 mg/dL (ref 0.40–1.20)
GFR: 123.24 mL/min (ref >60.00)
Glucose, Bld: 113 mg/dL — ABNORMAL HIGH (ref 70–99)
Potassium: 3.9 mEq/L (ref 3.5–5.1)
Sodium: 137 mEq/L (ref 135–145)
TOTAL PROTEIN: 7 g/dL (ref 6.0–8.3)
Total Bilirubin: 0.5 mg/dL (ref 0.2–1.2)

## 2015-04-24 LAB — TSH: TSH: 1.2 u[IU]/mL (ref 0.35–4.50)

## 2015-04-24 LAB — LIPID PANEL
CHOL/HDL RATIO: 5
CHOLESTEROL: 192 mg/dL (ref 0–200)
HDL: 39.8 mg/dL (ref >39.00)
LDL Cholesterol: 123 mg/dL — ABNORMAL HIGH (ref 0–99)
NONHDL: 152.2
Triglycerides: 145 mg/dL (ref 0.0–149.0)
VLDL: 29 mg/dL (ref 0.0–40.0)

## 2015-04-24 LAB — CBC
HEMATOCRIT: 39.6 % (ref 36.0–46.0)
HEMOGLOBIN: 13.4 g/dL (ref 12.0–15.0)
MCHC: 33.7 g/dL (ref 30.0–36.0)
MCV: 90.3 fl (ref 78.0–100.0)
Platelets: 253 10*3/uL (ref 150.0–400.0)
RBC: 4.39 Mil/uL (ref 3.87–5.11)
RDW: 15 % (ref 11.5–15.5)
WBC: 6.5 10*3/uL (ref 4.0–10.5)

## 2015-04-24 LAB — VITAMIN D 25 HYDROXY (VIT D DEFICIENCY, FRACTURES): VITD: 14.55 ng/mL — ABNORMAL LOW (ref 30.00–100.00)

## 2015-04-24 LAB — HEMOGLOBIN A1C: Hgb A1c MFr Bld: 5.5 % (ref 4.6–6.5)

## 2015-04-25 ENCOUNTER — Other Ambulatory Visit: Payer: Self-pay | Admitting: Family Medicine

## 2015-04-25 MED ORDER — VITAMIN D (ERGOCALCIFEROL) 1.25 MG (50000 UNIT) PO CAPS: 50000.0000 [IU] | ORAL_CAPSULE | ORAL | Status: DC

## 2015-04-28 ENCOUNTER — Encounter: Payer: Self-pay | Admitting: Family Medicine

## 2015-04-28 DIAGNOSIS — E559 Vitamin D deficiency, unspecified: Secondary | ICD-10-CM

## 2015-04-28 HISTORY — DX: Vitamin D deficiency, unspecified: E55.9

## 2015-04-28 NOTE — Assessment & Plan Note (Signed)
Started on Vitamin D 50000 IU q week and daily vit D, recheck level in 12 weeks

## 2015-04-28 NOTE — Progress Notes (Signed)
HONESTI SEABERG  [id number] 1941-02-25 04/28/2015      Progress Note-Follow Up  Subjective  Chief Complaint  Chief Complaint  Patient presents with  . Follow-up    HPI  Patient is a 74 y.o. female in today for routine medical care. Patient is in today for follow-up is generally doing well. Is struggling with back pain as well as hand and foot pain with some nodules. Pain is tolerable. Notes no recent illness. Encouraged increased hydration, 64 ounces of clear fluids daily. Minimize alcohol and caffeine. Eat small frequent meals with lean proteins and complex carbs. Avoid high and low blood sugars. Get adequate sleep, 7-8 hours a night. Needs exercise daily preferably in the morning.  Past Medical History  Diagnosis Date  . ANEMIA 08/23/2008  . BACK PAIN, LUMBAR, CHRONIC 02/18/2010  . CORONARY ARTERY DISEASE 08/23/2008  . GERD 08/23/2008  . HYPERLIPIDEMIA 08/23/2008  . HYPOTHYROIDISM 08/23/2008  . OSTEOPENIA 08/23/2008  . Arthritis 11/25/2011  . Cerumen impaction 11/25/2011  . Preventative health care 08/05/2012    [facility] [place], had a recent colonoscopy she reports being told she did not need another test for 10 years Sees dermatologist, Dr Vito Berger  . Hyperglycemia 08/05/2012  . HTN (hypertension) 09/21/2012  . Anxiety 09/21/2012  . Cystic acne 11/04/2012  . Ganglion cyst 11/04/2012    Right forearm and left wrist.  . Kidney stone 08/12/2013  . Vitamin D deficiency 04/28/2015    Past Surgical History  Procedure Laterality Date  . Angioplasty  [place]  . Coronary stent placement  08/2010  . Cholecystectomy      Family History  Problem Relation Age of Onset  . Dementia Mother   . Arthritis Mother     rheumatoid  . Heart disease Mother     chf  . Osteoporosis Mother     broken hip  . Heart disease Father     massive MI  . Heart attack Father   . Hyperlipidemia Sister   . Hyperlipidemia Brother   . Cancer Maternal Aunt    breast  . Heart disease Daughter     2 MIs, Pulmonic Valve disease  . Hyperlipidemia Daughter   . Hyperlipidemia Son   . Hypertension Son   . Cancer Maternal Grandmother     gyn  . Heart disease Maternal Grandmother     CHF  . Alcohol abuse Paternal Grandfather   . Dementia Paternal Grandfather   . Cancer Brother     brain tumor    History   Social History  . Marital Status: Married    Spouse Name: N/A  . Number of Children: N/A  . Years of Education: N/A   Occupational History  . Not on file.   Social History Main Topics  . Smoking status: Former Smoker    Quit date: 03/02/2009  . Smokeless tobacco: Not on file  . Alcohol Use: Yes  . Drug Use: No  . Sexual Activity: Not on file   Other Topics Concern  . Not on file   Social History Narrative    Current Outpatient Prescriptions on File Prior to Visit  Medication Sig Dispense Refill  . acyclovir (ZOVIRAX) 400 MG tablet Take 1 tablet by mouth two  times daily 180 tablet 1  . aspirin 81 MG tablet Take 81 mg by mouth daily.      . fish oil-omega-3 fatty acids 1000 MG capsule Take 2 g by mouth daily.      Marland Kitchen  HYDROcodone-acetaminophen (NORCO) 10-325 MG per tablet Take 1 tablet by mouth every 8 (eight) hours as needed. 180 tablet 0  . Krill Oil CAPS Take 1 capsule by mouth 1 day or 1 dose.    . minocycline (MINOCIN,DYNACIN) 50 MG capsule Take 2 capsules by mouth  two times daily 360 capsule 0  . nitroGLYCERIN (NITROSTAT) 0.4 MG SL tablet Place 1 tablet (0.4 mg total) under the tongue every 5 (five) minutes as needed for chest pain. 25 tablet 1  . prednisoLONE acetate (PRED FORTE) 1 % ophthalmic suspension Place 1 drop into the right eye daily as needed. 5 mL 1  . ranitidine (ZANTAC) 150 MG capsule Take by mouth 2 (two) times daily.    . sucralfate (CARAFATE) 1 G tablet Take 1 tablet (1 g total) by mouth 4 (four) times daily. 120 tablet 3   No current facility-administered medications on file prior to visit.    No  Known Allergies  Review of Systems  Review of Systems  Constitutional: Positive for malaise/fatigue. Negative for fever.  HENT: Negative for congestion.   Eyes: Negative for discharge.  Respiratory: Negative for shortness of breath.   Cardiovascular: Negative for chest pain, palpitations and leg swelling.  Gastrointestinal: Negative for nausea, abdominal pain and diarrhea.  Genitourinary: Negative for dysuria.  Musculoskeletal: Positive for back pain and joint pain. Negative for falls.  Skin: Negative for rash.  Neurological: Negative for loss of consciousness and headaches.  Endo/Heme/Allergies: Negative for polydipsia.  Psychiatric/Behavioral: Negative for depression and suicidal ideas. The patient is not nervous/anxious and does not have insomnia.     Objective  BP 122/72 mmHg  Pulse 60  Temp(Src) 98.3 F (36.8 C) (Oral)  Ht '5\' 3"'$  (1.6 m)  Wt 190 lb (86.183 kg)  BMI 33.67 kg/m2  SpO2 97%  LMP 11/23/1993  Physical Exam  Physical Exam  Constitutional: She is oriented to person, place, and time and well-developed, well-nourished, and in no distress. No distress.  HENT:  Head: Normocephalic and atraumatic.  Eyes: Conjunctivae are normal.  Neck: Neck supple. No thyromegaly present.  Cardiovascular: Normal rate, regular rhythm and normal heart sounds.   No murmur heard. Pulmonary/Chest: Effort normal and breath sounds normal. She has no wheezes.  Abdominal: She exhibits no distension and no mass.  Musculoskeletal: She exhibits no edema.  Lymphadenopathy:    She has no cervical adenopathy.  Neurological: She is alert and oriented to person, place, and time.  Skin: Skin is warm and dry. No rash noted. She is not diaphoretic.  Psychiatric: Memory, affect and judgment normal.    Lab Results  Component Value Date   TSH 1.20 04/24/2015   Lab Results  Component Value Date   WBC 6.5 04/24/2015   HGB 13.4 04/24/2015   HCT 39.6 04/24/2015   MCV 90.3 04/24/2015   PLT  253.0 04/24/2015   Lab Results  Component Value Date   CREATININE 0.52 04/24/2015   BUN 16 04/24/2015   NA 137 04/24/2015   K 3.9 04/24/2015   CL 105 04/24/2015   CO2 27 04/24/2015   Lab Results  Component Value Date   ALT 19 04/24/2015   AST 21 04/24/2015   ALKPHOS 89 04/24/2015   BILITOT 0.5 04/24/2015   Lab Results  Component Value Date   CHOL 192 04/24/2015   Lab Results  Component Value Date   HDL 39.80 04/24/2015   Lab Results  Component Value Date   LDLCALC 123* 04/24/2015   Lab Results  Component  Value Date   TRIG 145.0 04/24/2015   Lab Results  Component Value Date   CHOLHDL 5 04/24/2015     Assessment & Plan  HTN (hypertension) Well controlled, no changes to meds. Encouraged heart healthy diet such as the DASH diet and exercise as tolerated.    Hyperlipidemia, mixed Tolerating statin, encouraged heart healthy diet, avoid trans fats, minimize simple carbs and saturated fats. Increase exercise as tolerated   Obesity Encouraged DASH diet, decrease po intake and increase exercise as tolerated. Needs 7-8 hours of sleep nightly. Avoid trans fats, eat small, frequent meals every 4-5 hours with lean proteins, complex carbs and healthy fats. Minimize simple carbs, GMO foods.   Vitamin D deficiency Started on Vitamin D 50000 IU q week and daily vit D, recheck level in 12 weeks   Arthritis Nodules noted on hands and feet as well as back pain. Encouraged moist heat and gentle stretching as tolerated. May try NSAIDs and prescription meds as directed and report if symptoms worsen or seek immediate care

## 2015-04-28 NOTE — Assessment & Plan Note (Signed)
Encouraged DASH diet, decrease po intake and increase exercise as tolerated. Needs 7-8 hours of sleep nightly. Avoid trans fats, eat small, frequent meals every 4-5 hours with lean proteins, complex carbs and healthy fats. Minimize simple carbs, GMO foods. 

## 2015-04-28 NOTE — Assessment & Plan Note (Signed)
Tolerating statin, encouraged heart healthy diet, avoid trans fats, minimize simple carbs and saturated fats. Increase exercise as tolerated 

## 2015-04-28 NOTE — Assessment & Plan Note (Signed)
Well controlled, no changes to meds. Encouraged heart healthy diet such as the DASH diet and exercise as tolerated.  °

## 2015-04-28 NOTE — Assessment & Plan Note (Signed)
Nodules noted on hands and feet as well as back pain. Encouraged moist heat and gentle stretching as tolerated. May try NSAIDs and prescription meds as directed and report if symptoms worsen or seek immediate care

## 2015-09-26 ENCOUNTER — Other Ambulatory Visit: Payer: Self-pay | Admitting: Family Medicine

## 2015-12-03 DIAGNOSIS — I739 Peripheral vascular disease, unspecified: Secondary | ICD-10-CM | POA: Diagnosis not present

## 2015-12-03 DIAGNOSIS — Z955 Presence of coronary angioplasty implant and graft: Secondary | ICD-10-CM | POA: Diagnosis not present

## 2015-12-03 DIAGNOSIS — I471 Supraventricular tachycardia: Secondary | ICD-10-CM | POA: Diagnosis not present

## 2015-12-03 DIAGNOSIS — I2510 Atherosclerotic heart disease of native coronary artery without angina pectoris: Secondary | ICD-10-CM | POA: Diagnosis not present

## 2015-12-03 DIAGNOSIS — I10 Essential (primary) hypertension: Secondary | ICD-10-CM | POA: Diagnosis not present

## 2015-12-09 DIAGNOSIS — I739 Peripheral vascular disease, unspecified: Secondary | ICD-10-CM | POA: Diagnosis not present

## 2015-12-17 DIAGNOSIS — I10 Essential (primary) hypertension: Secondary | ICD-10-CM | POA: Diagnosis not present

## 2015-12-17 DIAGNOSIS — I2510 Atherosclerotic heart disease of native coronary artery without angina pectoris: Secondary | ICD-10-CM | POA: Diagnosis not present

## 2015-12-27 ENCOUNTER — Encounter: Payer: Self-pay | Admitting: Family Medicine

## 2015-12-27 ENCOUNTER — Ambulatory Visit (INDEPENDENT_AMBULATORY_CARE_PROVIDER_SITE_OTHER): Payer: Medicare Other | Admitting: Family Medicine

## 2015-12-27 VITALS — BP 130/72 | HR 59 | Temp 98.9°F | Ht 63.0 in | Wt 190.0 lb

## 2015-12-27 DIAGNOSIS — E559 Vitamin D deficiency, unspecified: Secondary | ICD-10-CM | POA: Diagnosis not present

## 2015-12-27 DIAGNOSIS — D649 Anemia, unspecified: Secondary | ICD-10-CM

## 2015-12-27 DIAGNOSIS — E669 Obesity, unspecified: Secondary | ICD-10-CM

## 2015-12-27 DIAGNOSIS — R52 Pain, unspecified: Secondary | ICD-10-CM

## 2015-12-27 DIAGNOSIS — E039 Hypothyroidism, unspecified: Secondary | ICD-10-CM | POA: Diagnosis not present

## 2015-12-27 DIAGNOSIS — I10 Essential (primary) hypertension: Secondary | ICD-10-CM | POA: Diagnosis not present

## 2015-12-27 DIAGNOSIS — R350 Frequency of micturition: Secondary | ICD-10-CM

## 2015-12-27 DIAGNOSIS — R739 Hyperglycemia, unspecified: Secondary | ICD-10-CM

## 2015-12-27 DIAGNOSIS — M545 Low back pain: Secondary | ICD-10-CM

## 2015-12-27 DIAGNOSIS — E782 Mixed hyperlipidemia: Secondary | ICD-10-CM | POA: Diagnosis not present

## 2015-12-27 DIAGNOSIS — F419 Anxiety disorder, unspecified: Secondary | ICD-10-CM

## 2015-12-27 LAB — LIPID PANEL
Cholesterol: 319 mg/dL — ABNORMAL HIGH (ref 125–200)
HDL: 35 mg/dL — ABNORMAL LOW (ref >=46)
LDL Cholesterol: 246 mg/dL — ABNORMAL HIGH (ref <130)
TRIGLYCERIDES: 189 mg/dL — AB (ref <150)
Total CHOL/HDL Ratio: 9.1 Ratio — ABNORMAL HIGH (ref <=5.0)
VLDL: 38 mg/dL — ABNORMAL HIGH (ref <30)

## 2015-12-27 LAB — COMPREHENSIVE METABOLIC PANEL
ALT: 15 U/L (ref 6–29)
AST: 19 U/L (ref 10–35)
Albumin: 4.1 g/dL (ref 3.6–5.1)
Alkaline Phosphatase: 86 U/L (ref 33–130)
BILIRUBIN TOTAL: 0.4 mg/dL (ref 0.2–1.2)
BUN: 24 mg/dL (ref 7–25)
CO2: 27 mmol/L (ref 20–31)
Calcium: 10 mg/dL (ref 8.6–10.4)
Chloride: 103 mmol/L (ref 98–110)
Creat: 0.56 mg/dL — ABNORMAL LOW (ref 0.60–0.93)
GLUCOSE: 99 mg/dL (ref 65–99)
POTASSIUM: 4 mmol/L (ref 3.5–5.3)
Sodium: 140 mmol/L (ref 135–146)
TOTAL PROTEIN: 7 g/dL (ref 6.1–8.1)

## 2015-12-27 LAB — CBC
HCT: 40.4 % (ref 36.0–46.0)
Hemoglobin: 13.3 g/dL (ref 12.0–15.0)
MCH: 30.4 pg (ref 26.0–34.0)
MCHC: 32.9 g/dL (ref 30.0–36.0)
MCV: 92.4 fL (ref 78.0–100.0)
MPV: 9.5 fL (ref 8.6–12.4)
PLATELETS: 254 10*3/uL (ref 150–400)
RBC: 4.37 MIL/uL (ref 3.87–5.11)
RDW: 14.2 % (ref 11.5–15.5)
WBC: 7.5 10*3/uL (ref 4.0–10.5)

## 2015-12-27 LAB — TSH: TSH: 1.121 u[IU]/mL (ref 0.350–4.500)

## 2015-12-27 MED ORDER — ACYCLOVIR 400 MG PO TABS: ORAL_TABLET | ORAL | Status: DC

## 2015-12-27 MED ORDER — ATENOLOL 50 MG PO TABS: 25.0000 mg | ORAL_TABLET | Freq: Every day | ORAL | Status: DC

## 2015-12-27 MED ORDER — ALPRAZOLAM 0.5 MG PO TABS: 0.2500 mg | ORAL_TABLET | Freq: Two times a day (BID) | ORAL | Status: DC | PRN

## 2015-12-27 MED ORDER — HYDROCODONE-ACETAMINOPHEN 10-325 MG PO TABS: 1.0000 | ORAL_TABLET | Freq: Three times a day (TID) | ORAL | Status: DC | PRN

## 2015-12-27 NOTE — Progress Notes (Signed)
Pre visit review using our clinic review tool, if applicable. No additional management support is needed unless otherwise documented below in the visit note. 

## 2015-12-27 NOTE — Patient Instructions (Addendum)
Salonpas or Aspercreme  DASH Eating Plan DASH stands for "Dietary Approaches to Stop Hypertension." The DASH eating plan is a healthy eating plan that has been shown to reduce high blood pressure (hypertension). Additional health benefits may include reducing the risk of type 2 diabetes mellitus, heart disease, and stroke. The DASH eating plan may also help with weight loss. WHAT DO I NEED TO KNOW ABOUT THE DASH EATING PLAN? For the DASH eating plan, you will follow these general guidelines:  Choose foods with a percent daily value for sodium of less than 5% (as listed on the food label).  Use salt-free seasonings or herbs instead of table salt or sea salt.  Check with your health care provider or pharmacist before using salt substitutes.  Eat lower-sodium products, often labeled as "lower sodium" or "no salt added."  Eat fresh foods.  Eat more vegetables, fruits, and low-fat dairy products.  Choose whole grains. Look for the word "whole" as the first word in the ingredient list.  Choose fish and skinless chicken or [place] more often than red meat. Limit fish, poultry, and meat to 6 oz (170 g) each day.  Limit sweets, desserts, sugars, and sugary drinks.  Choose heart-healthy fats.  Limit cheese to 1 oz (28 g) per day.  Eat more home-cooked food and less restaurant, buffet, and fast food.  Limit fried foods.  Cook foods using methods other than frying.  Limit canned vegetables. If you do use them, rinse them well to decrease the sodium.  When eating at a restaurant, ask that your food be prepared with less salt, or no salt if possible. WHAT FOODS CAN I EAT? Seek help from a dietitian for individual calorie needs. Grains Whole grain or whole wheat bread. Brown rice. Whole grain or whole wheat pasta. Quinoa, bulgur, and whole grain cereals. Low-sodium cereals. Corn or whole wheat flour tortillas. Whole grain cornbread. Whole grain crackers. Low-sodium  crackers. Vegetables Fresh or frozen vegetables (raw, steamed, roasted, or grilled). Low-sodium or reduced-sodium tomato and vegetable juices. Low-sodium or reduced-sodium tomato sauce and paste. Low-sodium or reduced-sodium canned vegetables.  Fruits All fresh, canned (in natural juice), or frozen fruits. Meat and Other Protein Products Ground beef (85% or leaner), grass-fed beef, or beef trimmed of fat. Skinless chicken or [place]. Ground chicken or [place]. Pork trimmed of fat. All fish and seafood. Eggs. Dried beans, peas, or lentils. Unsalted nuts and seeds. Unsalted canned beans. Dairy Low-fat dairy products, such as skim or 1% milk, 2% or reduced-fat cheeses, low-fat ricotta or cottage cheese, or plain low-fat yogurt. Low-sodium or reduced-sodium cheeses. Fats and Oils Tub margarines without trans fats. Light or reduced-fat mayonnaise and salad dressings (reduced sodium). Avocado. Safflower, olive, or canola oils. Natural peanut or almond butter. Other Unsalted popcorn and pretzels. The items listed above may not be a complete list of recommended foods or beverages. Contact your dietitian for more options. WHAT FOODS ARE NOT RECOMMENDED? Grains White bread. White pasta. White rice. Refined cornbread. Bagels and croissants. Crackers that contain trans fat. Vegetables Creamed or fried vegetables. Vegetables in a cheese sauce. Regular canned vegetables. Regular canned tomato sauce and paste. Regular tomato and vegetable juices. Fruits Dried fruits. Canned fruit in light or heavy syrup. Fruit juice. Meat and Other Protein Products Fatty cuts of meat. Ribs, chicken wings, bacon, sausage, bologna, salami, chitterlings, fatback, hot dogs, bratwurst, and packaged luncheon meats. Salted nuts and seeds. Canned beans with salt. Dairy Whole or 2% milk, cream, half-and-half, and cream cheese. Whole-fat  or sweetened yogurt. Full-fat cheeses or blue cheese. Nondairy creamers and whipped toppings.  Processed cheese, cheese spreads, or cheese curds. Condiments Onion and garlic salt, seasoned salt, table salt, and sea salt. Canned and packaged gravies. Worcestershire sauce. Tartar sauce. Barbecue sauce. Teriyaki sauce. Soy sauce, including reduced sodium. Steak sauce. Fish sauce. Oyster sauce. Cocktail sauce. Horseradish. Ketchup and mustard. Meat flavorings and tenderizers. Bouillon cubes. Hot sauce. Tabasco sauce. Marinades. Taco seasonings. Relishes. Fats and Oils Butter, stick margarine, lard, shortening, ghee, and bacon fat. Coconut, palm kernel, or palm oils. Regular salad dressings. Other Pickles and olives. Salted popcorn and pretzels. The items listed above may not be a complete list of foods and beverages to avoid. Contact your dietitian for more information. WHERE CAN I FIND MORE INFORMATION? National [employer], [employer], [employer]: travelstabloid.com   This information is not intended to replace advice given to you by your health care provider. Make sure you discuss any questions you have with your health care provider.   Document Released: 10/29/2011 Document Revised: 11/30/2014 Document Reviewed: 09/13/2013 Elsevier Interactive Patient Education [employer]. me gel 3 times daily as needed.

## 2015-12-27 NOTE — Progress Notes (Signed)
Subjective:    Patient ID: [name], female    DOB: 1941-08-02, 75 y.o.   MRN: [id number]  Chief Complaint  Patient presents with  . Medication Refill    HPI Patient is in today for follow up. Is struggling with fatigue and chronic pain. Is noting back pain, left knee pain and right wrist pain most notably but has pain throughout  No recent injury or acute concern otherwise. Denies CP/palp/SOB/HA/congestion/fevers/GI c/o. Taking meds as prescribed. Does not some urinary frequency urgency and incontinence but denies dysuria or hematuria.  Past Medical History  Diagnosis Date  . ANEMIA 08/23/2008  . BACK PAIN, LUMBAR, CHRONIC 02/18/2010  . CORONARY ARTERY DISEASE 08/23/2008  . GERD 08/23/2008  . HYPERLIPIDEMIA 08/23/2008  . HYPOTHYROIDISM 08/23/2008  . OSTEOPENIA 08/23/2008  . Arthritis 11/25/2011  . Cerumen impaction 11/25/2011  . Preventative health care 08/05/2012    Sees Dr Ilda Foil [facility] at [facility] in [place], had a recent colonoscopy she reports being told she did not need another test for 10 years Sees dermatologist, Dr Vito Berger  . Hyperglycemia 08/05/2012  . HTN (hypertension) 09/21/2012  . Anxiety 09/21/2012  . Cystic acne 11/04/2012  . Ganglion cyst 11/04/2012    Right forearm and left wrist.  . Kidney stone 08/12/2013  . Vitamin D deficiency 04/28/2015    Past Surgical History  Procedure Laterality Date  . Angioplasty  [place]  . Coronary stent placement  08/2010  . Cholecystectomy      Family History  Problem Relation Age of Onset  . Dementia Mother   . Arthritis Mother     rheumatoid  . Heart disease Mother     chf  . Osteoporosis Mother     broken hip  . Heart disease Father     massive MI  . Heart attack Father   . Hyperlipidemia Sister   . Hyperlipidemia Brother   . Cancer Maternal Aunt     breast  . Heart disease Daughter     2 MIs, Pulmonic Valve disease  . Hyperlipidemia Daughter   . Hyperlipidemia Son   . Hypertension Son   .  Cancer Maternal Grandmother     gyn  . Heart disease Maternal Grandmother     CHF  . Alcohol abuse Paternal Grandfather   . Dementia Paternal Grandfather   . Cancer Brother     brain tumor    Social History   Social History  . Marital Status: Married    Spouse Name: N/A  . Number of Children: N/A  . Years of Education: N/A   Occupational History  . Not on file.   Social History Main Topics  . Smoking status: Former Smoker    Quit date: 03/02/2009  . Smokeless tobacco: Not on file  . Alcohol Use: Yes  . Drug Use: No  . Sexual Activity: Not on file   Other Topics Concern  . Not on file   Social History Narrative    Outpatient Prescriptions Prior to Visit  Medication Sig Dispense Refill  . aspirin 81 MG tablet Take 81 mg by mouth daily.      . fish oil-omega-3 fatty acids 1000 MG capsule Take 2 g by mouth daily.      Astrid Drafts CAPS Take 1 capsule by mouth 1 day or 1 dose.    Marland Kitchen lisinopril (PRINIVIL,ZESTRIL) 10 MG tablet Take 1 tablet by mouth two  times daily 180 tablet 1  . minocycline (MINOCIN,DYNACIN) 50 MG  capsule Take 2 capsules by mouth  two times daily 360 capsule 0  . nitroGLYCERIN (NITROSTAT) 0.4 MG SL tablet Place 1 tablet (0.4 mg total) under the tongue every 5 (five) minutes as needed for chest pain. 25 tablet 1  . prednisoLONE acetate (PRED FORTE) 1 % ophthalmic suspension Place 1 drop into the right eye daily as needed. 5 mL 1  . verapamil (CALAN) 40 MG tablet Take 1 tablet by mouth  daily 90 tablet 1  . acyclovir (ZOVIRAX) 400 MG tablet Take 1 tablet by mouth two  times daily 180 tablet 1  . ALPRAZolam (XANAX) 0.5 MG tablet Take 0.5 tablets (0.25 mg total) by mouth 2 (two) times daily as needed for sleep or anxiety. 90 tablet 1  . atenolol (TENORMIN) 50 MG tablet Take 1 tablet by mouth  daily (Patient taking differently: take 1/2 tablet by mouth daily) 90 tablet 1  . HYDROcodone-acetaminophen (NORCO) 10-325 MG per tablet Take 1 tablet by mouth every 8  (eight) hours as needed. 180 tablet 0  . pravastatin (PRAVACHOL) 40 MG tablet Take 1 tablet by mouth  daily 90 tablet 1  . ranitidine (ZANTAC) 150 MG capsule Take by mouth 2 (two) times daily.    . sucralfate (CARAFATE) 1 G tablet Take 1 tablet (1 g total) by mouth 4 (four) times daily. 120 tablet 3  . Vitamin D, Ergocalciferol, (DRISDOL) 50000 UNITS CAPS capsule Take 1 capsule (50,000 Units total) by mouth every 7 (seven) days. (Patient not taking: Reported on 12/27/2015) 4 capsule 4   No facility-administered medications prior to visit.    No Known Allergies  Review of Systems  Constitutional: Negative for fever and malaise/fatigue.  HENT: Negative for congestion.   Eyes: Negative for discharge.  Respiratory: Negative for shortness of breath.   Cardiovascular: Negative for chest pain, palpitations and leg swelling.  Gastrointestinal: Negative for nausea, abdominal pain and blood in stool.  Genitourinary: Positive for urgency and frequency. Negative for hematuria and flank pain.  Musculoskeletal: Positive for back pain and joint pain. Negative for falls.  Skin: Negative for rash.  Neurological: Negative for loss of consciousness and headaches.  Endo/Heme/Allergies: Negative for environmental allergies.  Psychiatric/Behavioral: Negative for depression. The patient is not nervous/anxious.        Objective:    Physical Exam  Constitutional: She is oriented to person, place, and time. She appears well-developed and well-nourished. No distress.  HENT:  Head: Normocephalic and atraumatic.  Nose: Nose normal.  Eyes: Right eye exhibits no discharge. Left eye exhibits no discharge.  Neck: Normal range of motion. Neck supple.  Cardiovascular: Normal rate and regular rhythm.   No murmur heard. Pulmonary/Chest: Effort normal and breath sounds normal.  Abdominal: Soft. Bowel sounds are normal. There is no tenderness.  Musculoskeletal: She exhibits no edema.  Neurological: She is alert and  oriented to person, place, and time.  Skin: Skin is warm and dry.  Psychiatric: She has a normal mood and affect.  Nursing note and vitals reviewed.   BP 130/72 mmHg  Pulse 59  Temp(Src) 98.9 F (37.2 C) (Oral)  Ht '5\' 3"'$  (1.6 m)  Wt 190 lb (86.183 kg)  BMI 33.67 kg/m2  SpO2 97%  LMP 11/23/1993 Wt Readings from Last 3 Encounters:  12/27/15 190 lb (86.183 kg)  04/23/15 190 lb (86.183 kg)  10/23/13 188 lb 0.6 oz (85.294 kg)     Lab Results  Component Value Date   WBC 7.5 12/27/2015   HGB 13.3 12/27/2015  HCT 40.4 12/27/2015   PLT 254 12/27/2015   GLUCOSE 99 12/27/2015   CHOL 319* 12/27/2015   TRIG 189* 12/27/2015   HDL 35* 12/27/2015   LDLDIRECT 196.2 11/04/2012   LDLCALC 246* 12/27/2015   ALT 15 12/27/2015   AST 19 12/27/2015   NA 140 12/27/2015   K 4.0 12/27/2015   CL 103 12/27/2015   CREATININE 0.56* 12/27/2015   BUN 24 12/27/2015   CO2 27 12/27/2015   TSH 1.121 12/27/2015   HGBA1C 5.5 04/24/2015    Lab Results  Component Value Date   TSH 1.121 12/27/2015   Lab Results  Component Value Date   WBC 7.5 12/27/2015   HGB 13.3 12/27/2015   HCT 40.4 12/27/2015   MCV 92.4 12/27/2015   PLT 254 12/27/2015   Lab Results  Component Value Date   NA 140 12/27/2015   K 4.0 12/27/2015   CO2 27 12/27/2015   GLUCOSE 99 12/27/2015   BUN 24 12/27/2015   CREATININE 0.56* 12/27/2015   BILITOT 0.4 12/27/2015   ALKPHOS 86 12/27/2015   AST 19 12/27/2015   ALT 15 12/27/2015   PROT 7.0 12/27/2015   ALBUMIN 4.1 12/27/2015   CALCIUM 10.0 12/27/2015   GFR 123.24 04/24/2015   Lab Results  Component Value Date   CHOL 319* 12/27/2015   Lab Results  Component Value Date   HDL 35* 12/27/2015   Lab Results  Component Value Date   LDLCALC 246* 12/27/2015   Lab Results  Component Value Date   TRIG 189* 12/27/2015   Lab Results  Component Value Date   CHOLHDL 9.1* 12/27/2015   Lab Results  Component Value Date   HGBA1C 5.5 04/24/2015       Assessment &  Plan:   Problem List Items Addressed This Visit    ANEMIA   Relevant Orders   Comprehensive metabolic panel (Completed)   CBC (Completed)   Lipid panel (Completed)   Anxiety - Primary   Relevant Medications   ALPRAZolam (XANAX) 0.5 MG tablet   HTN (hypertension)    Well controlled, no changes to meds. Encouraged heart healthy diet such as the DASH diet and exercise as tolerated.       Relevant Medications   atenolol (TENORMIN) 50 MG tablet   Other Relevant Orders   Comprehensive metabolic panel (Completed)   CBC (Completed)   Hyperglycemia    minimize simple carbs. Increase exercise as tolerated.       Hyperlipidemia, mixed    Encouraged heart healthy diet, increase exercise, avoid trans fats, consider a krill oil cap daily. Atorvastatin 40 mg daily      Relevant Medications   atenolol (TENORMIN) 50 MG tablet   Other Relevant Orders   Lipid panel (Completed)   Hypothyroidism   Relevant Medications   atenolol (TENORMIN) 50 MG tablet   Other Relevant Orders   TSH (Completed)   Low back pain    Encouraged moist heat and gentle stretching as tolerated. May try NSAIDs and prescription meds as directed and report if symptoms worsen or seek immediate care. Try topical treatments. Check xray      Relevant Medications   HYDROcodone-acetaminophen (NORCO) 10-325 MG tablet   Other Relevant Orders   DG Lumbar Spine Complete   Obesity    Encouraged DASH diet, decrease po intake and increase exercise as tolerated. Needs 7-8 hours of sleep nightly. Avoid trans fats, eat small, frequent meals every 4-5 hours with lean proteins, complex carbs and healthy fats. Minimize simple  carbs      Urinary frequency    Urinalysis unremarkable      Relevant Orders   Urinalysis (Completed)   Vitamin D deficiency    Labs reveal deficiency. Start on Vitamin D 50000 IU caps, 1 cap po weekly x 12 weeks. Disp #4 with 4 rf. Also take daily Vitamin D over the counter. If already taking a daily  supplement increase by 1000 IU daily and if not start Vitamin D 2000 IU daily.       Relevant Orders   Vitamin D (25 hydroxy) (Completed)    Other Visit Diagnoses    Pain        Relevant Medications    HYDROcodone-acetaminophen (NORCO) 10-325 MG tablet       I have discontinued Ms. Eberle's ranitidine, sucralfate, and acyclovir. I have also changed her HYDROcodone-acetaminophen, atenolol, and acyclovir. Additionally, I am having her maintain her aspirin, fish oil-omega-3 fatty acids, Krill Oil, prednisoLONE acetate, nitroGLYCERIN, minocycline, verapamil, lisinopril, and ALPRAZolam.  Meds ordered this encounter  Medications  . DISCONTD: acyclovir (ZOVIRAX) 400 MG tablet    Sig: Take 1 tablet by mouth two  times daily    Dispense:  180 tablet    Refill:  1  . ALPRAZolam (XANAX) 0.5 MG tablet    Sig: Take 0.5 tablets (0.25 mg total) by mouth 2 (two) times daily as needed for sleep or anxiety.    Dispense:  90 tablet    Refill:  1  . HYDROcodone-acetaminophen (NORCO) 10-325 MG tablet    Sig: Take 1 tablet by mouth every 8 (eight) hours as needed.    Dispense:  180 tablet    Refill:  0  . atenolol (TENORMIN) 50 MG tablet    Sig: Take 0.5 tablets (25 mg total) by mouth daily.    Dispense:  90 tablet    Refill:  1  . acyclovir (ZOVIRAX) 400 MG tablet    Sig: Take 1 tablet by mouth two  times daily x 25 days.Then take 5 tablets daily x 5 days.    Dispense:  225 tablet    Refill:  [place], MD

## 2015-12-28 LAB — URINALYSIS
Bilirubin Urine: NEGATIVE
Glucose, UA: NEGATIVE
Hgb urine dipstick: NEGATIVE
KETONES UR: NEGATIVE
LEUKOCYTES UA: NEGATIVE
NITRITE: NEGATIVE
PH: 5.5 (ref 5.0–8.0)
Protein, ur: NEGATIVE
Specific Gravity, Urine: 1.027 (ref 1.001–1.035)

## 2015-12-28 LAB — VITAMIN D 25 HYDROXY (VIT D DEFICIENCY, FRACTURES): Vit D, 25-Hydroxy: 18 ng/mL — ABNORMAL LOW (ref 30–100)

## 2015-12-30 ENCOUNTER — Other Ambulatory Visit: Payer: Self-pay | Admitting: Family Medicine

## 2015-12-30 MED ORDER — ATORVASTATIN CALCIUM 40 MG PO TABS: 40.0000 mg | ORAL_TABLET | Freq: Every day | ORAL | Status: DC

## 2015-12-30 MED ORDER — VITAMIN D (ERGOCALCIFEROL) 1.25 MG (50000 UNIT) PO CAPS: 50000.0000 [IU] | ORAL_CAPSULE | ORAL | Status: DC

## 2015-12-31 DIAGNOSIS — L700 Acne vulgaris: Secondary | ICD-10-CM | POA: Diagnosis not present

## 2016-01-05 ENCOUNTER — Encounter: Payer: Self-pay | Admitting: Family Medicine

## 2016-01-05 DIAGNOSIS — E559 Vitamin D deficiency, unspecified: Secondary | ICD-10-CM | POA: Insufficient documentation

## 2016-01-05 DIAGNOSIS — M545 Low back pain, unspecified: Secondary | ICD-10-CM | POA: Insufficient documentation

## 2016-01-05 DIAGNOSIS — R350 Frequency of micturition: Secondary | ICD-10-CM | POA: Insufficient documentation

## 2016-01-05 NOTE — Assessment & Plan Note (Signed)
Encouraged heart healthy diet, increase exercise, avoid trans fats, consider a krill oil cap daily. Atorvastatin 40 mg daily

## 2016-01-05 NOTE — Assessment & Plan Note (Signed)
Encouraged moist heat and gentle stretching as tolerated. May try NSAIDs and prescription meds as directed and report if symptoms worsen or seek immediate care. Try topical treatments. Check xray

## 2016-01-05 NOTE — Assessment & Plan Note (Signed)
Urinalysis unremarkable

## 2016-01-05 NOTE — Assessment & Plan Note (Signed)
Encouraged DASH diet, decrease po intake and increase exercise as tolerated. Needs 7-8 hours of sleep nightly. Avoid trans fats, eat small, frequent meals every 4-5 hours with lean proteins, complex carbs and healthy fats. Minimize simple carbs 

## 2016-01-05 NOTE — Assessment & Plan Note (Signed)
Labs reveal deficiency. Start on Vitamin D 50000 IU caps, 1 cap po weekly x 12 weeks. Disp #4 with 4 rf. Also take daily Vitamin D over the counter. If already taking a daily supplement increase by 1000 IU daily and if not start Vitamin D 2000 IU daily.

## 2016-01-05 NOTE — Assessment & Plan Note (Signed)
Well controlled, no changes to meds. Encouraged heart healthy diet such as the DASH diet and exercise as tolerated.  °

## 2016-01-05 NOTE — Assessment & Plan Note (Signed)
minimize simple carbs. Increase exercise as tolerated.  

## 2016-01-09 ENCOUNTER — Telehealth: Payer: Self-pay | Admitting: Family Medicine

## 2016-01-09 MED ORDER — VITAMIN D (ERGOCALCIFEROL) 1.25 MG (50000 UNIT) PO CAPS: 50000.0000 [IU] | ORAL_CAPSULE | ORAL | Status: DC

## 2016-01-09 NOTE — Telephone Encounter (Signed)
Sent in D to local [facility]. Patient called and informed

## 2016-01-09 NOTE — Telephone Encounter (Signed)
Relation to BM:WUXL Call back [phone] Pharmacy: [id number] [phone]) [phone](Fax)         Reason for call:  Patient states Vitamin D, Ergocalciferol, (DRISDOL) 50000 units CAPS capsule is cheaper thu [facility] as per patient please send to retail.

## 2016-01-21 ENCOUNTER — Other Ambulatory Visit: Payer: Self-pay | Admitting: Family Medicine

## 2016-01-21 NOTE — Telephone Encounter (Signed)
Advise as this medication not on current list.

## 2016-01-28 ENCOUNTER — Telehealth: Payer: Self-pay | Admitting: Family Medicine

## 2016-01-28 NOTE — Telephone Encounter (Signed)
Called the patient informed of PCP response.  Patient stated she felt due to the distance of our location from her home she may try and find another provider.  She felt that since she was recently seen by PCP she should send something in for her.

## 2016-01-28 NOTE — Telephone Encounter (Signed)
Symptoms are sore throat and nasal congestion, started last night.

## 2016-01-28 NOTE — Telephone Encounter (Signed)
Need to know more. Any congestion, fevers, headache, nausea or other concerns? Then I can decide what to do

## 2016-01-28 NOTE — Telephone Encounter (Signed)
Without fever and having just started last night, would treat as viral. Encouraged increased rest and hydration, add probiotic such as [employer], zinc such as Coldeze or Xicam. Treat fevers as needed. Try Mucinex bid and add some elderberry, if new symptoms develop come in for eval or let us know

## 2016-01-28 NOTE — Telephone Encounter (Signed)
Understood, it is not that I would never call something in it is just that it has only been 24 hours and without fever it is likely to be viral. The zpak is not without risk ie CDiff infection, abnormal heart rhythm etc so you do not want to do it unless the risk benefit equation is worth it.

## 2016-01-28 NOTE — Telephone Encounter (Signed)
Relation to VO:HYWV Call back number:[phone] Pharmacy:  [id number] [place] [phone](Phone) [phone](Fax)         Reason for call:  Patient states she has a really bad sore throat requesting a Z Pack, patient states she lives 45 minute away.

## 2016-02-07 ENCOUNTER — Encounter: Payer: Self-pay | Admitting: Internal Medicine

## 2016-02-07 ENCOUNTER — Ambulatory Visit (INDEPENDENT_AMBULATORY_CARE_PROVIDER_SITE_OTHER): Payer: Medicare Other | Admitting: Internal Medicine

## 2016-02-07 VITALS — BP 132/76 | HR 57 | Temp 98.1°F | Ht 63.0 in | Wt 190.3 lb

## 2016-02-07 DIAGNOSIS — J019 Acute sinusitis, unspecified: Secondary | ICD-10-CM

## 2016-02-07 MED ORDER — AMOXICILLIN 875 MG PO TABS: 875.0000 mg | ORAL_TABLET | Freq: Two times a day (BID) | ORAL | Status: DC

## 2016-02-07 NOTE — Progress Notes (Signed)
Pre visit review using our clinic review tool, if applicable. No additional management support is needed unless otherwise documented below in the visit note. 

## 2016-02-07 NOTE — Patient Instructions (Signed)
Rest, fluids , tylenol  For cough:  Take Mucinex DM twice a day as needed until better  For nasal congestion: Use OTC Nasocort or Flonase : 2 nasal sprays on each side of the nose in the morning until you feel better   Avoid decongestants such as  Pseudoephedrine or phenylephrine   Take the antibiotic as prescribed  (Amoxicillin)  Call if not gradually better over the next  10 days  Call anytime if the symptoms are severe

## 2016-02-07 NOTE — Progress Notes (Signed)
Subjective:    Patient ID: [name], female    DOB: 1941-07-27, 75 y.o.   MRN: [id number]  DOS:  02/07/2016 Type of visit - description : Acute visit Interval history: Symptoms started 2 weeks ago with mild sore throat, gradually getting worse with sinus congestion, green nasal discharge and cough. taking a number of OTCs without improvement.   Review of Systems + Subjective fever on and off, no chills. No nausea vomiting Does not feel like she has chest congestion or actually sputum production, she feels she is coughing up nasal discharge. No rash no headache.  Past Medical History  Diagnosis Date  . ANEMIA 08/23/2008  . BACK PAIN, LUMBAR, CHRONIC 02/18/2010  . CORONARY ARTERY DISEASE 08/23/2008  . GERD 08/23/2008  . HYPERLIPIDEMIA 08/23/2008  . HYPOTHYROIDISM 08/23/2008  . OSTEOPENIA 08/23/2008  . Arthritis 11/25/2011  . Cerumen impaction 11/25/2011  . Preventative health care 08/05/2012    Sees Dr Ilda Foil [facility] GI at [facility] in [place], had a recent colonoscopy she reports being told she did not need another test for 10 years Sees dermatologist, Dr Vito Berger  . Hyperglycemia 08/05/2012  . HTN (hypertension) 09/21/2012  . Anxiety 09/21/2012  . Cystic acne 11/04/2012  . Ganglion cyst 11/04/2012    Right forearm and left wrist.  . Kidney stone 08/12/2013  . Vitamin D deficiency 04/28/2015    Past Surgical History  Procedure Laterality Date  . Angioplasty  [place]  . Coronary stent placement  08/2010  . Cholecystectomy      Social History   Social History  . Marital Status: Married    Spouse Name: N/A  . Number of Children: N/A  . Years of Education: N/A   Occupational History  . Not on file.   Social History Main Topics  . Smoking status: Former Smoker    Quit date: 03/02/2009  . Smokeless tobacco: Not on file  . Alcohol Use: Yes  . Drug Use: No  . Sexual Activity: Not on file   Other Topics Concern  . Not on file   Social History Narrative        Medication List       This list is accurate as of: 02/07/16 11:59 PM.  Always use your most recent med list.               acyclovir 400 MG tablet  Commonly known as:  ZOVIRAX  Take 1 tablet by mouth two  times daily x 25 days.Then take 5 tablets daily x 5 days.     ALPRAZolam 0.5 MG tablet  Commonly known as:  XANAX  Take 0.5 tablets (0.25 mg total) by mouth 2 (two) times daily as needed for sleep or anxiety.     amoxicillin 875 MG tablet  Commonly known as:  AMOXIL  Take 1 tablet (875 mg total) by mouth 2 (two) times daily.     aspirin 81 MG tablet  Take 81 mg by mouth daily.     atenolol 50 MG tablet  Commonly known as:  TENORMIN  Take 0.5 tablets (25 mg total) by mouth daily.     atorvastatin 40 MG tablet  Commonly known as:  LIPITOR  Take 1 tablet (40 mg total) by mouth daily.     fish oil-omega-3 fatty acids 1000 MG capsule  Take 2 g by mouth daily.     HYDROcodone-acetaminophen 10-325 MG tablet  Commonly known as:  NORCO  Take 1 tablet by mouth every 8 (  eight) hours as needed.     Krill Oil Caps  Take 1 capsule by mouth 1 day or 1 dose.     lisinopril 10 MG tablet  Commonly known as:  PRINIVIL,ZESTRIL  Take 1 tablet by mouth two  times daily     minocycline 50 MG capsule  Commonly known as:  MINOCIN,DYNACIN  Take 2 capsules by mouth  two times daily     nitroGLYCERIN 0.4 MG SL tablet  Commonly known as:  NITROSTAT  Place 1 tablet (0.4 mg total) under the tongue every 5 (five) minutes as needed for chest pain.     prednisoLONE acetate 1 % ophthalmic suspension  Commonly known as:  PRED FORTE  Place 1 drop into the right eye daily as needed.     verapamil 40 MG tablet  Commonly known as:  CALAN  Take 1 tablet by mouth  every day     Vitamin D (Ergocalciferol) 50000 units Caps capsule  Commonly known as:  DRISDOL  Take 1 capsule (50,000 Units total) by mouth every 7 (seven) days.           Objective:   Physical Exam BP 132/76 mmHg   Pulse 57  Temp(Src) 98.1 F (36.7 C) (Oral)  Ht '5\' 3"'$  (1.6 m)  Wt 190 lb 5 oz (86.325 kg)  BMI 33.72 kg/m2  SpO2 96%  LMP 11/23/1993 General:   Well developed, well nourished . NAD.  HEENT:  Normocephalic . Face symmetric, atraumatic. TMs normal, nose is slightly congested, sinuses no TTP, throat without redness Lungs:  CTA B Normal respiratory effort, no intercostal retractions, no accessory muscle use. Heart: RRR,  no murmur.  No pretibial edema bilaterally  Skin: Not pale. Not jaundice Neurologic:  alert & oriented X3.  Speech normal, gait appropriate for age and unassisted Psych--  Cognition and judgment appear intact.  Cooperative with normal attention span and concentration.  Behavior appropriate. No anxious or depressed appearing.      Assessment & Plan:   Mild sinusitis: Respiratory symptoms for 2 weeks with green nasal discharge and subjective fever, likely mild sinusitis. Plan: C instructions

## 2016-02-12 ENCOUNTER — Other Ambulatory Visit: Payer: Self-pay | Admitting: Family Medicine

## 2016-02-28 ENCOUNTER — Other Ambulatory Visit: Payer: Self-pay | Admitting: Internal Medicine

## 2016-02-28 MED ORDER — AZITHROMYCIN 250 MG PO TABS: ORAL_TABLET | ORAL | Status: DC

## 2016-02-28 NOTE — Telephone Encounter (Signed)
OK to prescribe Azithromycin 250 mg tabs 2 tabs po once then 1 tab po daily x 4 days if no better needs to come in. Take a probiotic

## 2016-02-28 NOTE — Telephone Encounter (Signed)
Dr Larose Kells patient

## 2016-02-28 NOTE — Telephone Encounter (Signed)
Sent in antibiotic as instructed. Called the patient informed of PCP instructions.

## 2016-03-03 DIAGNOSIS — L700 Acne vulgaris: Secondary | ICD-10-CM | POA: Diagnosis not present

## 2016-03-30 ENCOUNTER — Other Ambulatory Visit: Payer: Self-pay

## 2016-03-30 DIAGNOSIS — Z1231 Encounter for screening mammogram for malignant neoplasm of breast: Secondary | ICD-10-CM

## 2016-03-31 ENCOUNTER — Other Ambulatory Visit: Payer: Self-pay | Admitting: Family Medicine

## 2016-03-31 DIAGNOSIS — F419 Anxiety disorder, unspecified: Secondary | ICD-10-CM

## 2016-03-31 MED ORDER — ALPRAZOLAM 0.5 MG PO TABS: 0.2500 mg | ORAL_TABLET | Freq: Two times a day (BID) | ORAL | Status: DC | PRN

## 2016-03-31 NOTE — Telephone Encounter (Signed)
Last refill on 12/27/2015 #90 with 1 refill Last Office visit on 12/27/2015 No Contract/UDS

## 2016-03-31 NOTE — Telephone Encounter (Signed)
Faxed hardcopy for alprazolam to [facility]

## 2016-04-21 DIAGNOSIS — L700 Acne vulgaris: Secondary | ICD-10-CM | POA: Diagnosis not present

## 2016-04-27 ENCOUNTER — Ambulatory Visit
Admission: RE | Admit: 2016-04-27 | Discharge: 2016-04-27 | Disposition: A | Discharge status: Home / Self Care | Payer: Medicare Other | Source: Ambulatory Visit

## 2016-04-27 DIAGNOSIS — Z1231 Encounter for screening mammogram for malignant neoplasm of breast: Secondary | ICD-10-CM | POA: Diagnosis not present

## 2016-05-05 ENCOUNTER — Encounter: Payer: Self-pay | Admitting: Family Medicine

## 2016-05-05 ENCOUNTER — Ambulatory Visit (INDEPENDENT_AMBULATORY_CARE_PROVIDER_SITE_OTHER): Payer: Medicare Other | Admitting: Family Medicine

## 2016-05-05 VITALS — BP 120/64 | HR 60 | Temp 98.4°F | Ht 63.0 in | Wt 190.0 lb

## 2016-05-05 DIAGNOSIS — E039 Hypothyroidism, unspecified: Secondary | ICD-10-CM

## 2016-05-05 DIAGNOSIS — M545 Low back pain: Secondary | ICD-10-CM

## 2016-05-05 DIAGNOSIS — R52 Pain, unspecified: Secondary | ICD-10-CM

## 2016-05-05 DIAGNOSIS — E782 Mixed hyperlipidemia: Secondary | ICD-10-CM

## 2016-05-05 DIAGNOSIS — R739 Hyperglycemia, unspecified: Secondary | ICD-10-CM

## 2016-05-05 DIAGNOSIS — Z23 Encounter for immunization: Secondary | ICD-10-CM

## 2016-05-05 DIAGNOSIS — I10 Essential (primary) hypertension: Secondary | ICD-10-CM | POA: Diagnosis not present

## 2016-05-05 DIAGNOSIS — D649 Anemia, unspecified: Secondary | ICD-10-CM

## 2016-05-05 DIAGNOSIS — Z0000 Encounter for general adult medical examination without abnormal findings: Secondary | ICD-10-CM

## 2016-05-05 DIAGNOSIS — F419 Anxiety disorder, unspecified: Secondary | ICD-10-CM

## 2016-05-05 DIAGNOSIS — Z0001 Encounter for general adult medical examination with abnormal findings: Secondary | ICD-10-CM

## 2016-05-05 MED ORDER — ALPRAZOLAM 0.5 MG PO TABS: 0.2500 mg | ORAL_TABLET | Freq: Two times a day (BID) | ORAL | Status: DC | PRN

## 2016-05-05 MED ORDER — HYDROCODONE-ACETAMINOPHEN 10-325 MG PO TABS: 1.0000 | ORAL_TABLET | Freq: Three times a day (TID) | ORAL | Status: DC | PRN

## 2016-05-05 NOTE — Patient Instructions (Addendum)
[facility] for hearing evaluation Living will and HCP  Preventive Care for Adults, Female A healthy lifestyle and preventive care can promote health and wellness. Preventive health guidelines for women include the following key practices.  A routine yearly physical is a good way to check with your health care provider about your health and preventive screening. It is a chance to share any concerns and updates on your health and to receive a thorough exam.  Visit your dentist for a routine exam and preventive care every 6 months. Brush your teeth twice a day and floss once a day. Good oral hygiene prevents tooth decay and gum disease.  The frequency of eye exams is based on your age, health, family medical history, use of contact lenses, and other factors. Follow your health care provider's recommendations for frequency of eye exams.  Eat a healthy diet. Foods like vegetables, fruits, whole grains, low-fat dairy products, and lean protein foods contain the nutrients you need without too many calories. Decrease your intake of foods high in solid fats, added sugars, and salt. Eat the right amount of calories for you.Get information about a proper diet from your health care provider, if necessary.  Regular physical exercise is one of the most important things you can do for your health. Most adults should get at least 150 minutes of moderate-intensity exercise (any activity that increases your heart rate and causes you to sweat) each week. In addition, most adults need muscle-strengthening exercises on 2 or more days a week.  Maintain a healthy weight. The body mass index (BMI) is a screening tool to identify possible weight problems. It provides an estimate of body fat based on height and weight. Your health care provider can find your BMI and can help you achieve or maintain a healthy weight.For adults 20 years and older:  A BMI below 18.5 is considered underweight.  A BMI of 18.5 to 24.9 is  normal.  A BMI of 25 to 29.9 is considered overweight.  A BMI of 30 and above is considered obese.  Maintain normal blood lipids and cholesterol levels by exercising and minimizing your intake of saturated fat. Eat a balanced diet with plenty of fruit and vegetables. Blood tests for lipids and cholesterol should begin at age 25 and be repeated every 5 years. If your lipid or cholesterol levels are high, you are over 50, or you are at high risk for heart disease, you may need your cholesterol levels checked more frequently.Ongoing high lipid and cholesterol levels should be treated with medicines if diet and exercise are not working.  If you smoke, find out from your health care provider how to quit. If you do not use tobacco, do not start.  Lung cancer screening is recommended for adults aged 76-80 years who are at high risk for developing lung cancer because of a history of smoking. A yearly low-dose CT scan of the lungs is recommended for people who have at least a 30-pack-year history of smoking and are a current smoker or have quit within the past 15 years. A pack year of smoking is smoking an average of 1 pack of cigarettes a day for 1 year (for example: 1 pack a day for 30 years or 2 packs a day for 15 years). Yearly screening should continue until the smoker has stopped smoking for at least 15 years. Yearly screening should be stopped for people who develop a health problem that would prevent them from having lung cancer treatment.  If you  are pregnant, do not drink alcohol. If you are breastfeeding, be very cautious about drinking alcohol. If you are not pregnant and choose to drink alcohol, do not have more than 1 drink per day. One drink is considered to be 12 ounces (355 mL) of beer, 5 ounces (148 mL) of wine, or 1.5 ounces (44 mL) of liquor.  Avoid use of street drugs. Do not share needles with anyone. Ask for help if you need support or instructions about stopping the use of  drugs.  High blood pressure causes heart disease and increases the risk of stroke. Your blood pressure should be checked at least every 1 to 2 years. Ongoing high blood pressure should be treated with medicines if weight loss and exercise do not work.  If you are 48-70 years old, ask your health care provider if you should take aspirin to prevent strokes.  Diabetes screening is done by taking a blood sample to check your blood glucose level after you have not eaten for a certain period of time (fasting). If you are not overweight and you do not have risk factors for diabetes, you should be screened once every 3 years starting at age 59. If you are overweight or obese and you are 98-49 years of age, you should be screened for diabetes every year as part of your cardiovascular risk assessment.  Breast cancer screening is essential preventive care for women. You should practice "breast self-awareness." This means understanding the normal appearance and feel of your breasts and may include breast self-examination. Any changes detected, no matter how small, should be reported to a health care provider. Women in their 28s and 30s should have a clinical breast exam (CBE) by a health care provider as part of a regular health exam every 1 to 3 years. After age 6, women should have a CBE every year. Starting at age 23, women should consider having a mammogram (breast X-ray test) every year. Women who have a family history of breast cancer should talk to their health care provider about genetic screening. Women at a high risk of breast cancer should talk to their health care providers about having an MRI and a mammogram every year.  Breast cancer gene (BRCA)-related cancer risk assessment is recommended for women who have family members with BRCA-related cancers. BRCA-related cancers include breast, ovarian, tubal, and peritoneal cancers. Having family members with these cancers may be associated with an increased  risk for harmful changes (mutations) in the breast cancer genes BRCA1 and BRCA2. Results of the assessment will determine the need for genetic counseling and BRCA1 and BRCA2 testing.  Your health care provider may recommend that you be screened regularly for cancer of the pelvic organs (ovaries, uterus, and vagina). This screening involves a pelvic examination, including checking for microscopic changes to the surface of your cervix (Pap test). You may be encouraged to have this screening done every 3 years, beginning at age 2.  For women ages 32-65, health care providers may recommend pelvic exams and Pap testing every 3 years, or they may recommend the Pap and pelvic exam, combined with testing for human papilloma virus (HPV), every 5 years. Some types of HPV increase your risk of cervical cancer. Testing for HPV may also be done on women of any age with unclear Pap test results.  Other health care providers may not recommend any screening for nonpregnant women who are considered low risk for pelvic cancer and who do not have symptoms. Ask your health care  provider if a screening pelvic exam is right for you.  If you have had past treatment for cervical cancer or a condition that could lead to cancer, you need Pap tests and screening for cancer for at least 20 years after your treatment. If Pap tests have been discontinued, your risk factors (such as having a new sexual partner) need to be reassessed to determine if screening should resume. Some women have medical problems that increase the chance of getting cervical cancer. In these cases, your health care provider may recommend more frequent screening and Pap tests.  Colorectal cancer can be detected and often prevented. Most routine colorectal cancer screening begins at the age of 86 years and continues through age 46 years. However, your health care provider may recommend screening at an earlier age if you have risk factors for colon cancer. On a  yearly basis, your health care provider may provide home test kits to check for hidden blood in the stool. Use of a small camera at the end of a tube, to directly examine the colon (sigmoidoscopy or colonoscopy), can detect the earliest forms of colorectal cancer. Talk to your health care provider about this at age 73, when routine screening begins. Direct exam of the colon should be repeated every 5-10 years through age 87 years, unless early forms of precancerous polyps or small growths are found.  People who are at an increased risk for hepatitis B should be screened for this virus. You are considered at high risk for hepatitis B if:  You were born in a country where hepatitis B occurs often. Talk with your health care provider about which countries are considered high risk.  Your parents were born in a high-risk country and you have not received a shot to protect against hepatitis B (hepatitis B vaccine).  You have HIV or AIDS.  You use needles to inject street drugs.  You live with, or have sex with, someone who has hepatitis B.  You get hemodialysis treatment.  You take certain medicines for conditions like cancer, organ transplantation, and autoimmune conditions.  Hepatitis C blood testing is recommended for all people born from [birth year] through 1965 and any individual with known risks for hepatitis C.  Practice safe sex. Use condoms and avoid high-risk sexual practices to reduce the spread of sexually transmitted infections (STIs). STIs include gonorrhea, chlamydia, syphilis, trichomonas, herpes, HPV, and human immunodeficiency virus (HIV). Herpes, HIV, and HPV are viral illnesses that have no cure. They can result in disability, cancer, and death.  You should be screened for sexually transmitted illnesses (STIs) including gonorrhea and chlamydia if:  You are sexually active and are younger than 24 years.  You are older than 24 years and your health care provider tells you that you are  at risk for this type of infection.  Your sexual activity has changed since you were last screened and you are at an increased risk for chlamydia or gonorrhea. Ask your health care provider if you are at risk.  If you are at risk of being infected with HIV, it is recommended that you take a prescription medicine daily to prevent HIV infection. This is called preexposure prophylaxis (PrEP). You are considered at risk if:  You are sexually active and do not regularly use condoms or know the HIV status of your partner(s).  You take drugs by injection.  You are sexually active with a partner who has HIV.  Talk with your health care provider about whether you are  at high risk of being infected with HIV. If you choose to begin PrEP, you should first be tested for HIV. You should then be tested every 3 months for as long as you are taking PrEP.  Osteoporosis is a disease in which the bones lose minerals and strength with aging. This can result in serious bone fractures or breaks. The risk of osteoporosis can be identified using a bone density scan. Women ages 103 years and over and women at risk for fractures or osteoporosis should discuss screening with their health care providers. Ask your health care provider whether you should take a calcium supplement or vitamin D to reduce the rate of osteoporosis.  Menopause can be associated with physical symptoms and risks. Hormone replacement therapy is available to decrease symptoms and risks. You should talk to your health care provider about whether hormone replacement therapy is right for you.  Use sunscreen. Apply sunscreen liberally and repeatedly throughout the day. You should seek shade when your shadow is shorter than you. Protect yourself by wearing long sleeves, pants, a wide-brimmed hat, and sunglasses year round, whenever you are outdoors.  Once a month, do a whole body skin exam, using a mirror to look at the skin on your back. Tell your health  care provider of new moles, moles that have irregular borders, moles that are larger than a pencil eraser, or moles that have changed in shape or color.  Stay current with required vaccines (immunizations).  Influenza vaccine. All adults should be immunized every year.  Tetanus, diphtheria, and acellular pertussis (Td, Tdap) vaccine. Pregnant women should receive 1 dose of Tdap vaccine during each pregnancy. The dose should be obtained regardless of the length of time since the last dose. Immunization is preferred during the 27th-36th week of gestation. An adult who has not previously received Tdap or who does not know her vaccine status should receive 1 dose of Tdap. This initial dose should be followed by tetanus and diphtheria toxoids (Td) booster doses every 10 years. Adults with an unknown or incomplete history of completing a 3-dose immunization series with Td-containing vaccines should begin or complete a primary immunization series including a Tdap dose. Adults should receive a Td booster every 10 years.  Varicella vaccine. An adult without evidence of immunity to varicella should receive 2 doses or a second dose if she has previously received 1 dose. Pregnant females who do not have evidence of immunity should receive the first dose after pregnancy. This first dose should be obtained before leaving the health care facility. The second dose should be obtained 4-8 weeks after the first dose.  Human papillomavirus (HPV) vaccine. Females aged 13-26 years who have not received the vaccine previously should obtain the 3-dose series. The vaccine is not recommended for use in pregnant females. However, pregnancy testing is not needed before receiving a dose. If a female is found to be pregnant after receiving a dose, no treatment is needed. In that case, the remaining doses should be delayed until after the pregnancy. Immunization is recommended for any person with an immunocompromised condition through  the age of 81 years if she did not get any or all doses earlier. During the 3-dose series, the second dose should be obtained 4-8 weeks after the first dose. The third dose should be obtained 24 weeks after the first dose and 16 weeks after the second dose.  Zoster vaccine. One dose is recommended for adults aged 21 years or older unless certain conditions are present.  Measles, mumps, and rubella (MMR) vaccine. Adults born before [birth year] generally are considered immune to measles and mumps. Adults born in [birth year] or later should have 1 or more doses of MMR vaccine unless there is a contraindication to the vaccine or there is laboratory evidence of immunity to each of the three diseases. A routine second dose of MMR vaccine should be obtained at least 28 days after the first dose for students attending postsecondary schools, health care workers, or international travelers. People who received inactivated measles vaccine or an unknown type of measles vaccine during 1963-1967 should receive 2 doses of MMR vaccine. People who received inactivated mumps vaccine or an unknown type of mumps vaccine before 1979 and are at high risk for mumps infection should consider immunization with 2 doses of MMR vaccine. For females of childbearing age, rubella immunity should be determined. If there is no evidence of immunity, females who are not pregnant should be vaccinated. If there is no evidence of immunity, females who are pregnant should delay immunization until after pregnancy. Unvaccinated health care workers born before [birth year] who lack laboratory evidence of measles, mumps, or rubella immunity or laboratory confirmation of disease should consider measles and mumps immunization with 2 doses of MMR vaccine or rubella immunization with 1 dose of MMR vaccine.  Pneumococcal 13-valent conjugate (PCV13) vaccine. When indicated, a person who is uncertain of his immunization history and has no record of immunization should receive the  PCV13 vaccine. All adults 53 years of age and older should receive this vaccine. An adult aged 37 years or older who has certain medical conditions and has not been previously immunized should receive 1 dose of PCV13 vaccine. This PCV13 should be followed with a dose of pneumococcal polysaccharide (PPSV23) vaccine. Adults who are at high risk for pneumococcal disease should obtain the PPSV23 vaccine at least 8 weeks after the dose of PCV13 vaccine. Adults older than 73 years of age who have normal immune system function should obtain the PPSV23 vaccine dose at least 1 year after the dose of PCV13 vaccine.  Pneumococcal polysaccharide (PPSV23) vaccine. When PCV13 is also indicated, PCV13 should be obtained first. All adults aged 54 years and older should be immunized. An adult younger than age 65 years who has certain medical conditions should be immunized. Any person who resides in a nursing home or long-term care facility should be immunized. An adult smoker should be immunized. People with an immunocompromised condition and certain other conditions should receive both PCV13 and PPSV23 vaccines. People with human immunodeficiency virus (HIV) infection should be immunized as soon as possible after diagnosis. Immunization during chemotherapy or radiation therapy should be avoided. Routine use of PPSV23 vaccine is not recommended for American Indians, [place] Natives, or people younger than 65 years unless there are medical conditions that require PPSV23 vaccine. When indicated, people who have unknown immunization and have no record of immunization should receive PPSV23 vaccine. One-time revaccination 5 years after the first dose of PPSV23 is recommended for people aged 19-64 years who have chronic kidney failure, nephrotic syndrome, asplenia, or immunocompromised conditions. People who received 1-2 doses of PPSV23 before age 40 years should receive another dose of PPSV23 vaccine at age 64 years or later if at least  5 years have passed since the previous dose. Doses of PPSV23 are not needed for people immunized with PPSV23 at or after age 102 years.  Meningococcal vaccine. Adults with asplenia or persistent complement component deficiencies should receive 2 doses of quadrivalent meningococcal  conjugate (MenACWY-D) vaccine. The doses should be obtained at least 2 months apart. Microbiologists working with certain meningococcal bacteria, [employer] recruits, people at risk during an outbreak, and people who travel to or live in countries with a high rate of meningitis should be immunized. A first-year college student up through age 65 years who is living in a residence hall should receive a dose if she did not receive a dose on or after her 16th birthday. Adults who have certain high-risk conditions should receive one or more doses of vaccine.  Hepatitis A vaccine. Adults who wish to be protected from this disease, have certain high-risk conditions, work with hepatitis A-infected animals, work in hepatitis A research labs, or travel to or work in countries with a high rate of hepatitis A should be immunized. Adults who were previously unvaccinated and who anticipate close contact with an international adoptee during the first 60 days after arrival in the [place] from a country with a high rate of hepatitis A should be immunized.  Hepatitis B vaccine. Adults who wish to be protected from this disease, have certain high-risk conditions, may be exposed to blood or other infectious body fluids, are household contacts or sex partners of hepatitis B positive people, are clients or workers in certain care facilities, or travel to or work in countries with a high rate of hepatitis B should be immunized.  Haemophilus influenzae type b (Hib) vaccine. A previously unvaccinated person with asplenia or sickle cell disease or having a scheduled splenectomy should receive 1 dose of Hib vaccine. Regardless of previous immunization, a  recipient of a hematopoietic stem cell transplant should receive a 3-dose series 6-12 months after her successful transplant. Hib vaccine is not recommended for adults with HIV infection. Preventive Services / Frequency Ages 85 to 67 years  Blood pressure check.** / Every 3-5 years.  Lipid and cholesterol check.** / Every 5 years beginning at age 77.  Clinical breast exam.** / Every 3 years for women in their 38s and 44s.  BRCA-related cancer risk assessment.** / For women who have family members with a BRCA-related cancer (breast, ovarian, tubal, or peritoneal cancers).  Pap test.** / Every 2 years from ages 60 through 5. Every 3 years starting at age 37 through age 53 or 71 with a history of 3 consecutive normal Pap tests.  HPV screening.** / Every 3 years from ages 66 through ages 77 to 38 with a history of 3 consecutive normal Pap tests.  Hepatitis C blood test.** / For any individual with known risks for hepatitis C.  Skin self-exam. / Monthly.  Influenza vaccine. / Every year.  Tetanus, diphtheria, and acellular pertussis (Tdap, Td) vaccine.** / Consult your health care provider. Pregnant women should receive 1 dose of Tdap vaccine during each pregnancy. 1 dose of Td every 10 years.  Varicella vaccine.** / Consult your health care provider. Pregnant females who do not have evidence of immunity should receive the first dose after pregnancy.  HPV vaccine. / 3 doses over 6 months, if 75 and younger. The vaccine is not recommended for use in pregnant females. However, pregnancy testing is not needed before receiving a dose.  Measles, mumps, rubella (MMR) vaccine.** / You need at least 1 dose of MMR if you were born in 1957 or later. You may also need a 2nd dose. For females of childbearing age, rubella immunity should be determined. If there is no evidence of immunity, females who are not pregnant should be vaccinated. If there  is no evidence of immunity, females who are pregnant  should delay immunization until after pregnancy.  Pneumococcal 13-valent conjugate (PCV13) vaccine.** / Consult your health care provider.  Pneumococcal polysaccharide (PPSV23) vaccine.** / 1 to 2 doses if you smoke cigarettes or if you have certain conditions.  Meningococcal vaccine.** / 1 dose if you are age 42 to 34 years and a Market researcher living in a residence hall, or have one of several medical conditions, you need to get vaccinated against meningococcal disease. You may also need additional booster doses.  Hepatitis A vaccine.** / Consult your health care provider.  Hepatitis B vaccine.** / Consult your health care provider.  Haemophilus influenzae type b (Hib) vaccine.** / Consult your health care provider. Ages 59 to 89 years  Blood pressure check.** / Every year.  Lipid and cholesterol check.** / Every 5 years beginning at age 45 years.  Lung cancer screening. / Every year if you are aged 4-80 years and have a 30-pack-year history of smoking and currently smoke or have quit within the past 15 years. Yearly screening is stopped once you have quit smoking for at least 15 years or develop a health problem that would prevent you from having lung cancer treatment.  Clinical breast exam.** / Every year after age 25 years.  BRCA-related cancer risk assessment.** / For women who have family members with a BRCA-related cancer (breast, ovarian, tubal, or peritoneal cancers).  Mammogram.** / Every year beginning at age 58 years and continuing for as long as you are in good health. Consult with your health care provider.  Pap test.** / Every 3 years starting at age 51 years through age 33 or 56 years with a history of 3 consecutive normal Pap tests.  HPV screening.** / Every 3 years from ages 36 years through ages 34 to 70 years with a history of 3 consecutive normal Pap tests.  Fecal occult blood test (FOBT) of stool. / Every year beginning at age 64 years and  continuing until age 57 years. You may not need to do this test if you get a colonoscopy every 10 years.  Flexible sigmoidoscopy or colonoscopy.** / Every 5 years for a flexible sigmoidoscopy or every 10 years for a colonoscopy beginning at age 21 years and continuing until age 31 years.  Hepatitis C blood test.** / For all people born from [birth year] through 1965 and any individual with known risks for hepatitis C.  Skin self-exam. / Monthly.  Influenza vaccine. / Every year.  Tetanus, diphtheria, and acellular pertussis (Tdap/Td) vaccine.** / Consult your health care provider. Pregnant women should receive 1 dose of Tdap vaccine during each pregnancy. 1 dose of Td every 10 years.  Varicella vaccine.** / Consult your health care provider. Pregnant females who do not have evidence of immunity should receive the first dose after pregnancy.  Zoster vaccine.** / 1 dose for adults aged 40 years or older.  Measles, mumps, rubella (MMR) vaccine.** / You need at least 1 dose of MMR if you were born in 1957 or later. You may also need a second dose. For females of childbearing age, rubella immunity should be determined. If there is no evidence of immunity, females who are not pregnant should be vaccinated. If there is no evidence of immunity, females who are pregnant should delay immunization until after pregnancy.  Pneumococcal 13-valent conjugate (PCV13) vaccine.** / Consult your health care provider.  Pneumococcal polysaccharide (PPSV23) vaccine.** / 1 to 2 doses if you smoke cigarettes or if  you have certain conditions.  Meningococcal vaccine.** / Consult your health care provider.  Hepatitis A vaccine.** / Consult your health care provider.  Hepatitis B vaccine.** / Consult your health care provider.  Haemophilus influenzae type b (Hib) vaccine.** / Consult your health care provider. Ages 27 years and over  Blood pressure check.** / Every year.  Lipid and cholesterol check.** / Every 5 years  beginning at age 53 years.  Lung cancer screening. / Every year if you are aged 7-80 years and have a 30-pack-year history of smoking and currently smoke or have quit within the past 15 years. Yearly screening is stopped once you have quit smoking for at least 15 years or develop a health problem that would prevent you from having lung cancer treatment.  Clinical breast exam.** / Every year after age 97 years.  BRCA-related cancer risk assessment.** / For women who have family members with a BRCA-related cancer (breast, ovarian, tubal, or peritoneal cancers).  Mammogram.** / Every year beginning at age 47 years and continuing for as long as you are in good health. Consult with your health care provider.  Pap test.** / Every 3 years starting at age 81 years through age 34 or 84 years with 3 consecutive normal Pap tests. Testing can be stopped between 65 and 70 years with 3 consecutive normal Pap tests and no abnormal Pap or HPV tests in the past 10 years.  HPV screening.** / Every 3 years from ages 51 years through ages 50 or 58 years with a history of 3 consecutive normal Pap tests. Testing can be stopped between 65 and 70 years with 3 consecutive normal Pap tests and no abnormal Pap or HPV tests in the past 10 years.  Fecal occult blood test (FOBT) of stool. / Every year beginning at age 53 years and continuing until age 1 years. You may not need to do this test if you get a colonoscopy every 10 years.  Flexible sigmoidoscopy or colonoscopy.** / Every 5 years for a flexible sigmoidoscopy or every 10 years for a colonoscopy beginning at age 57 years and continuing until age 34 years.  Hepatitis C blood test.** / For all people born from [birth year] through 1963 and any individual with known risks for hepatitis C.  Osteoporosis screening.** / A one-time screening for women ages 2 years and over and women at risk for fractures or osteoporosis.  Skin self-exam. / Monthly.  Influenza vaccine. /  Every year.  Tetanus, diphtheria, and acellular pertussis (Tdap/Td) vaccine.** / 1 dose of Td every 10 years.  Varicella vaccine.** / Consult your health care provider.  Zoster vaccine.** / 1 dose for adults aged 1 years or older.  Pneumococcal 13-valent conjugate (PCV13) vaccine.** / Consult your health care provider.  Pneumococcal polysaccharide (PPSV23) vaccine.** / 1 dose for all adults aged 26 years and older.  Meningococcal vaccine.** / Consult your health care provider.  Hepatitis A vaccine.** / Consult your health care provider.  Hepatitis B vaccine.** / Consult your health care provider.  Haemophilus influenzae type b (Hib) vaccine.** / Consult your health care provider. ** Family history and personal history of risk and conditions may change your health care provider's recommendations.   This information is not intended to replace advice given to you by your health care provider. Make sure you discuss any questions you have with your health care provider.   Document Released: 01/05/2002 Document Revised: 11/30/2014 Document Reviewed: 04/06/2011 Elsevier Interactive Patient Education [employer].

## 2016-05-05 NOTE — Progress Notes (Signed)
Pre visit review using our clinic review tool, if applicable. No additional management support is needed unless otherwise documented below in the visit note. 

## 2016-05-06 LAB — CBC
HEMATOCRIT: 39.3 % (ref 36.0–46.0)
HEMOGLOBIN: 13.3 g/dL (ref 12.0–15.0)
MCHC: 33.9 g/dL (ref 30.0–36.0)
MCV: 89.4 fl (ref 78.0–100.0)
PLATELETS: 249 10*3/uL (ref 150.0–400.0)
RBC: 4.39 Mil/uL (ref 3.87–5.11)
RDW: 15.1 % (ref 11.5–15.5)
WBC: 7.2 10*3/uL (ref 4.0–10.5)

## 2016-05-06 LAB — COMPREHENSIVE METABOLIC PANEL
ALK PHOS: 80 U/L (ref 39–117)
ALT: 19 U/L (ref 0–35)
AST: 22 U/L (ref 0–37)
Albumin: 4.3 g/dL (ref 3.5–5.2)
BUN: 27 mg/dL — AB (ref 6–23)
CHLORIDE: 106 meq/L (ref 96–112)
CO2: 27 meq/L (ref 19–32)
Calcium: 9.7 mg/dL (ref 8.4–10.5)
Creatinine, Ser: 0.64 mg/dL (ref 0.40–1.20)
GFR: 96.7 mL/min (ref >60.00)
GLUCOSE: 95 mg/dL (ref 70–99)
POTASSIUM: 4.1 meq/L (ref 3.5–5.1)
SODIUM: 141 meq/L (ref 135–145)
TOTAL PROTEIN: 7.1 g/dL (ref 6.0–8.3)
Total Bilirubin: 0.5 mg/dL (ref 0.2–1.2)

## 2016-05-06 LAB — LIPID PANEL
CHOL/HDL RATIO: 6
Cholesterol: 228 mg/dL — ABNORMAL HIGH (ref 0–200)
HDL: 35.9 mg/dL — AB (ref >39.00)
LDL CALC: 161 mg/dL — AB (ref 0–99)
NONHDL: 192.19
Triglycerides: 156 mg/dL — ABNORMAL HIGH (ref 0.0–149.0)
VLDL: 31.2 mg/dL (ref 0.0–40.0)

## 2016-05-06 LAB — VITAMIN D 25 HYDROXY (VIT D DEFICIENCY, FRACTURES): VITD: 29.51 ng/mL — AB (ref 30.00–100.00)

## 2016-05-06 LAB — TSH: TSH: 0.43 u[IU]/mL (ref 0.35–4.50)

## 2016-05-06 LAB — HEMOGLOBIN A1C: Hgb A1c MFr Bld: 5.7 % (ref 4.6–6.5)

## 2016-05-17 DIAGNOSIS — R52 Pain, unspecified: Secondary | ICD-10-CM | POA: Insufficient documentation

## 2016-05-17 NOTE — Assessment & Plan Note (Signed)
Left flank. Encouraged moist heat and gentle stretching as tolerated. May try NSAIDs and prescription meds as directed a. Topical patches. nd report if symptoms worsen or seek immediate care. Encouraged topical treatments and may use Hydrocodone sparingly.

## 2016-05-17 NOTE — Assessment & Plan Note (Addendum)
Encouraged heart healthy diet, increase exercise, avoid trans fats, consider a krill oil cap daily. Tolerating Atorvastatin 

## 2016-05-17 NOTE — Progress Notes (Signed)
Patient ID: [name], female   DOB: 1941-09-01, 75 y.o.   MRN: [id number]   Subjective:    Patient ID: [name], female    DOB: 1941-11-05, 75 y.o.   MRN: [id number]  Chief Complaint  Patient presents with  . Annual Exam    HPI Patient is in today for annual exam and follow up. Did have to present to an Urgent Care in April but has been well controlled since then without any acute complaints   Visit. Denies CP/palp/SOB/HA/congestion/fevers/GI or GU c/o. Taking meds as prescribed. Continue to struggle with anxiety and stress. Past Medical History  Diagnosis Date  . ANEMIA 08/23/2008  . BACK PAIN, LUMBAR, CHRONIC 02/18/2010  . CORONARY ARTERY DISEASE 08/23/2008  . GERD 08/23/2008  . HYPERLIPIDEMIA 08/23/2008  . HYPOTHYROIDISM 08/23/2008  . OSTEOPENIA 08/23/2008  . Arthritis 11/25/2011  . Cerumen impaction 11/25/2011  . Preventative health care 08/05/2012    [facility] [place], had a recent colonoscopy she reports being told she did not need another test for 10 years Sees dermatologist, Dr Vito Berger  . Hyperglycemia 08/05/2012  . HTN (hypertension) 09/21/2012  . Anxiety 09/21/2012  . Cystic acne 11/04/2012  . Ganglion cyst 11/04/2012    Right forearm and left wrist.  . Kidney stone 08/12/2013  . Vitamin D deficiency 04/28/2015    Past Surgical History  Procedure Laterality Date  . Angioplasty  [address]  . Coronary stent placement  08/2010  . Cholecystectomy      Family History  Problem Relation Age of Onset  . Dementia Mother   . Arthritis Mother     rheumatoid  . Heart disease Mother     chf  . Osteoporosis Mother     broken hip  . Heart disease Father     massive MI  . Heart attack Father   . Hyperlipidemia Sister   . Hyperlipidemia Brother   . Cancer Maternal Aunt     breast  . Heart disease Daughter     2 MIs, Pulmonic Valve disease  . Hyperlipidemia Daughter   . Hyperlipidemia Son   . Hypertension Son   . Cancer  Maternal Grandmother     gyn  . Heart disease Maternal Grandmother     CHF  . Alcohol abuse Paternal Grandfather   . Dementia Paternal Grandfather   . Cancer Brother     brain tumor    Social History   Social History  . Marital Status: Married    Spouse Name: N/A  . Number of Children: N/A  . Years of Education: N/A   Occupational History  . Not on file.   Social History Main Topics  . Smoking status: Former Smoker    Quit date: 03/02/2009  . Smokeless tobacco: Not on file  . Alcohol Use: Yes  . Drug Use: No  . Sexual Activity: Not on file   Other Topics Concern  . Not on file   Social History Narrative    Outpatient Prescriptions Prior to Visit  Medication Sig Dispense Refill  . acyclovir (ZOVIRAX) 400 MG tablet Take 1 tablet by mouth two  times daily x 25 days.Then take 5 tablets daily x 5 days. (Patient taking differently: 400 mg 2 (two) times daily. ) 225 tablet 1  . acyclovir (ZOVIRAX) 400 MG tablet Take 1 tablet by mouth two  times a day 180 tablet 0  . aspirin 81 MG tablet Take 81 mg by mouth  daily.      . atenolol (TENORMIN) 50 MG tablet Take 1 tablet by mouth  every day 90 tablet 1  . atorvastatin (LIPITOR) 40 MG tablet Take 1 tablet by mouth  daily 90 tablet 0  . fish oil-omega-3 fatty acids 1000 MG capsule Take 2 g by mouth daily.      Astrid Drafts CAPS Take 1 capsule by mouth 1 day or 1 dose.    Marland Kitchen lisinopril (PRINIVIL,ZESTRIL) 10 MG tablet Take 1 tablet by mouth two  times daily 180 tablet 1  . nitroGLYCERIN (NITROSTAT) 0.4 MG SL tablet Place 1 tablet (0.4 mg total) under the tongue every 5 (five) minutes as needed for chest pain. 25 tablet 1  . prednisoLONE acetate (PRED FORTE) 1 % ophthalmic suspension Place 1 drop into the right eye daily as needed. 5 mL 1  . verapamil (CALAN) 40 MG tablet Take 1 tablet by mouth  every day 90 tablet 1  . ALPRAZolam (XANAX) 0.5 MG tablet Take 0.5 tablets (0.25 mg total) by mouth 2 (two) times daily as needed for sleep or  anxiety. 90 tablet 1  . amoxicillin (AMOXIL) 875 MG tablet Take 1 tablet (875 mg total) by mouth 2 (two) times daily. 20 tablet 0  . azithromycin (ZITHROMAX) 250 MG tablet Take 2 by mouth once then 1 daily for 4 days. 6 each 0  . HYDROcodone-acetaminophen (NORCO) 10-325 MG tablet Take 1 tablet by mouth every 8 (eight) hours as needed. 180 tablet 0  . minocycline (MINOCIN,DYNACIN) 50 MG capsule Take 2 capsules by mouth  two times daily 360 capsule 0  . Vitamin D, Ergocalciferol, (DRISDOL) 50000 units CAPS capsule Take 1 capsule (50,000 Units total) by mouth every 7 (seven) days. 4 capsule 4   No facility-administered medications prior to visit.    No Known Allergies  Review of Systems  Constitutional: Negative for fever, chills and malaise/fatigue.  HENT: Negative for congestion and hearing loss.   Eyes: Negative for discharge.  Respiratory: Negative for cough, sputum production and shortness of breath.   Cardiovascular: Negative for chest pain, palpitations and leg swelling.  Gastrointestinal: Negative for heartburn, nausea, vomiting, abdominal pain, diarrhea, constipation and blood in stool.  Genitourinary: Negative for dysuria, urgency, frequency and hematuria.  Musculoskeletal: Negative for myalgias, back pain and falls.  Skin: Negative for rash.  Neurological: Negative for dizziness, sensory change, loss of consciousness, weakness and headaches.  Endo/Heme/Allergies: Negative for environmental allergies. Does not bruise/bleed easily.  Psychiatric/Behavioral: Negative for depression and suicidal ideas. The patient is not nervous/anxious and does not have insomnia.        Objective:    Physical Exam  Constitutional: She is oriented to person, place, and time. She appears well-developed and well-nourished. No distress.  HENT:  Head: Normocephalic and atraumatic.  Nose: Nose normal.  Eyes: Right eye exhibits no discharge. Left eye exhibits no discharge.  Neck: Normal range of  motion. Neck supple.  Cardiovascular: Normal rate and regular rhythm.   No murmur heard. Pulmonary/Chest: Effort normal and breath sounds normal.  Abdominal: Soft. Bowel sounds are normal. There is no tenderness.  Musculoskeletal: She exhibits no edema.  Neurological: She is alert and oriented to person, place, and time.  Skin: Skin is warm and dry.  Psychiatric: She has a normal mood and affect.  Nursing note and vitals reviewed.   BP 120/64 mmHg  Pulse 60  Temp(Src) 98.4 F (36.9 C) (Oral)  Ht '5\' 3"'$  (1.6 m)  Wt 190 lb (  86.183 kg)  BMI 33.67 kg/m2  SpO2 97%  LMP 11/23/1993 Wt Readings from Last 3 Encounters:  05/05/16 190 lb (86.183 kg)  02/07/16 190 lb 5 oz (86.325 kg)  12/27/15 190 lb (86.183 kg)     Lab Results  Component Value Date   WBC 7.2 05/05/2016   HGB 13.3 05/05/2016   HCT 39.3 05/05/2016   PLT 249.0 05/05/2016   GLUCOSE 95 05/05/2016   CHOL 228* 05/05/2016   TRIG 156.0* 05/05/2016   HDL 35.90* 05/05/2016   LDLDIRECT 196.2 11/04/2012   LDLCALC 161* 05/05/2016   ALT 19 05/05/2016   AST 22 05/05/2016   NA 141 05/05/2016   K 4.1 05/05/2016   CL 106 05/05/2016   CREATININE 0.64 05/05/2016   BUN 27* 05/05/2016   CO2 27 05/05/2016   TSH 0.43 05/05/2016   HGBA1C 5.7 05/05/2016    Lab Results  Component Value Date   TSH 0.43 05/05/2016   Lab Results  Component Value Date   WBC 7.2 05/05/2016   HGB 13.3 05/05/2016   HCT 39.3 05/05/2016   MCV 89.4 05/05/2016   PLT 249.0 05/05/2016   Lab Results  Component Value Date   NA 141 05/05/2016   K 4.1 05/05/2016   CO2 27 05/05/2016   GLUCOSE 95 05/05/2016   BUN 27* 05/05/2016   CREATININE 0.64 05/05/2016   BILITOT 0.5 05/05/2016   ALKPHOS 80 05/05/2016   AST 22 05/05/2016   ALT 19 05/05/2016   PROT 7.1 05/05/2016   ALBUMIN 4.3 05/05/2016   CALCIUM 9.7 05/05/2016   GFR 96.70 05/05/2016   Lab Results  Component Value Date   CHOL 228* 05/05/2016   Lab Results  Component Value Date   HDL  35.90* 05/05/2016   Lab Results  Component Value Date   LDLCALC 161* 05/05/2016   Lab Results  Component Value Date   TRIG 156.0* 05/05/2016   Lab Results  Component Value Date   CHOLHDL 6 05/05/2016   Lab Results  Component Value Date   HGBA1C 5.7 05/05/2016       Assessment & Plan:   Problem List Items Addressed This Visit    Preventative health care    Patient denies any difficulties at home. No trouble with ADLs, depression or falls. See EMR for functional status screen and depression screen. No recent changes to vision or hearing. Is UTD with immunizations. Is UTD with screening. Discussed Advanced Directives. Encouraged heart healthy diet, exercise as tolerated and adequate sleep. See patient's problem list for health risk factors to monitor. See AVS for preventative healthcare recommendation schedule. Labs revied.      Pain   Relevant Medications   ALPRAZolam (XANAX) 0.5 MG tablet   HYDROcodone-acetaminophen (NORCO) 10-325 MG tablet   Other Relevant Orders   TSH (Completed)   CBC (Completed)   VITAMIN D 25 Hydroxy (Vit-D Deficiency, Fractures) (Completed)   Lipid panel (Completed)   Comprehensive metabolic panel (Completed)   Low back pain    Left flank. Encouraged moist heat and gentle stretching as tolerated. May try NSAIDs and prescription meds as directed a. Topical patches. nd report if symptoms worsen or seek immediate care. Encouraged topical treatments and may use Hydrocodone sparingly.      Relevant Medications   HYDROcodone-acetaminophen (NORCO) 10-325 MG tablet   Hypothyroidism   Relevant Medications   ALPRAZolam (XANAX) 0.5 MG tablet   HYDROcodone-acetaminophen (NORCO) 10-325 MG tablet   Other Relevant Orders   TSH (Completed)   CBC (Completed)  VITAMIN D 25 Hydroxy (Vit-D Deficiency, Fractures) (Completed)   Lipid panel (Completed)   Comprehensive metabolic panel (Completed)   Hyperlipidemia, mixed    Encouraged heart healthy diet, increase  exercise, avoid trans fats, consider a krill oil cap daily. Tolerating Atorvastatin.       Relevant Medications   ALPRAZolam (XANAX) 0.5 MG tablet   HYDROcodone-acetaminophen (NORCO) 10-325 MG tablet   Other Relevant Orders   TSH (Completed)   CBC (Completed)   VITAMIN D 25 Hydroxy (Vit-D Deficiency, Fractures) (Completed)   Lipid panel (Completed)   Comprehensive metabolic panel (Completed)   Hyperglycemia    hgba1c acceptable, minimize simple carbs. Increase exercise as tolerated.       Relevant Medications   ALPRAZolam (XANAX) 0.5 MG tablet   HYDROcodone-acetaminophen (NORCO) 10-325 MG tablet   Other Relevant Orders   TSH (Completed)   CBC (Completed)   VITAMIN D 25 Hydroxy (Vit-D Deficiency, Fractures) (Completed)   Lipid panel (Completed)   Comprehensive metabolic panel (Completed)   Hemoglobin A1c (Completed)   HTN (hypertension)    Well controlled, no changes to meds. Encouraged heart healthy diet such as the DASH diet and exercise as tolerated.       Relevant Medications   ALPRAZolam (XANAX) 0.5 MG tablet   HYDROcodone-acetaminophen (NORCO) 10-325 MG tablet   Other Relevant Orders   TSH (Completed)   CBC (Completed)   VITAMIN D 25 Hydroxy (Vit-D Deficiency, Fractures) (Completed)   Lipid panel (Completed)   Comprehensive metabolic panel (Completed)   Anxiety - Primary   Relevant Medications   ALPRAZolam (XANAX) 0.5 MG tablet   HYDROcodone-acetaminophen (NORCO) 10-325 MG tablet   Other Relevant Orders   TSH (Completed)   CBC (Completed)   VITAMIN D 25 Hydroxy (Vit-D Deficiency, Fractures) (Completed)   Lipid panel (Completed)   Comprehensive metabolic panel (Completed)   ANEMIA   Relevant Medications   ALPRAZolam (XANAX) 0.5 MG tablet   HYDROcodone-acetaminophen (NORCO) 10-325 MG tablet   Other Relevant Orders   TSH (Completed)   CBC (Completed)   VITAMIN D 25 Hydroxy (Vit-D Deficiency, Fractures) (Completed)   Lipid panel (Completed)   Comprehensive  metabolic panel (Completed)    Other Visit Diagnoses    Need for vaccination with 13-polyvalent pneumococcal conjugate vaccine        Relevant Orders    Pneumococcal conjugate vaccine 13-valent (Completed)       I have discontinued Ms. Tousley's minocycline, Vitamin D (Ergocalciferol), amoxicillin, and azithromycin. I am also having her maintain her aspirin, fish oil-omega-3 fatty acids, Krill Oil, prednisoLONE acetate, nitroGLYCERIN, acyclovir, verapamil, atenolol, lisinopril, atorvastatin, acyclovir, ALPRAZolam, and HYDROcodone-acetaminophen.  Meds ordered this encounter  Medications  . ALPRAZolam (XANAX) 0.5 MG tablet    Sig: Take 0.5 tablets (0.25 mg total) by mouth 2 (two) times daily as needed for sleep or anxiety.    Dispense:  90 tablet    Refill:  1  . HYDROcodone-acetaminophen (NORCO) 10-325 MG tablet    Sig: Take 1 tablet by mouth every 8 (eight) hours as needed.    Dispense:  180 tablet    Refill:  0     Penni Homans, MD

## 2016-05-17 NOTE — Assessment & Plan Note (Signed)
hgba1c acceptable, minimize simple carbs. Increase exercise as tolerated.  

## 2016-05-17 NOTE — Assessment & Plan Note (Signed)
Well controlled, no changes to meds. Encouraged heart healthy diet such as the DASH diet and exercise as tolerated.  °

## 2016-05-17 NOTE — Assessment & Plan Note (Signed)
Patient denies any difficulties at home. No trouble with ADLs, depression or falls. See EMR for functional status screen and depression screen. No recent changes to vision or hearing. Is UTD with immunizations. Is UTD with screening. Discussed Advanced Directives. Encouraged heart healthy diet, exercise as tolerated and adequate sleep. See patient's problem list for health risk factors to monitor. See AVS for preventative healthcare recommendation schedule. Labs revied.

## 2016-05-20 DIAGNOSIS — I10 Essential (primary) hypertension: Secondary | ICD-10-CM | POA: Diagnosis not present

## 2016-05-20 DIAGNOSIS — E785 Hyperlipidemia, unspecified: Secondary | ICD-10-CM | POA: Diagnosis not present

## 2016-05-20 DIAGNOSIS — I739 Peripheral vascular disease, unspecified: Secondary | ICD-10-CM | POA: Diagnosis not present

## 2016-05-20 DIAGNOSIS — I2510 Atherosclerotic heart disease of native coronary artery without angina pectoris: Secondary | ICD-10-CM | POA: Diagnosis not present

## 2016-05-20 DIAGNOSIS — I471 Supraventricular tachycardia: Secondary | ICD-10-CM | POA: Diagnosis not present

## 2016-07-10 ENCOUNTER — Ambulatory Visit: Payer: Self-pay | Admitting: Medical

## 2016-07-21 ENCOUNTER — Other Ambulatory Visit: Payer: Self-pay | Admitting: Family Medicine

## 2016-08-26 ENCOUNTER — Ambulatory Visit (INDEPENDENT_AMBULATORY_CARE_PROVIDER_SITE_OTHER): Payer: Medicare Other

## 2016-08-26 DIAGNOSIS — Z23 Encounter for immunization: Secondary | ICD-10-CM

## 2016-09-01 ENCOUNTER — Telehealth: Payer: Self-pay | Admitting: Family Medicine

## 2016-09-01 NOTE — Telephone Encounter (Signed)
Patient would like to switch back to [facility]. The location is closer to patient's home. Is that okay?  Cynthia Lukes, MD  [facility] [place]        Sounds good. Dr B   I am ok with the transfer.  Dr. Alonza Mcmillan patient today. Transfer appt has been scheduled.

## 2016-09-05 ENCOUNTER — Other Ambulatory Visit: Payer: Self-pay | Admitting: Family Medicine

## 2016-09-14 ENCOUNTER — Other Ambulatory Visit: Payer: Self-pay | Admitting: Family Medicine

## 2016-09-14 DIAGNOSIS — F419 Anxiety disorder, unspecified: Secondary | ICD-10-CM

## 2016-09-14 DIAGNOSIS — R739 Hyperglycemia, unspecified: Secondary | ICD-10-CM

## 2016-09-14 DIAGNOSIS — E782 Mixed hyperlipidemia: Secondary | ICD-10-CM

## 2016-09-14 DIAGNOSIS — I10 Essential (primary) hypertension: Secondary | ICD-10-CM

## 2016-09-14 DIAGNOSIS — R52 Pain, unspecified: Secondary | ICD-10-CM

## 2016-09-14 MED ORDER — VERAPAMIL HCL 40 MG PO TABS: 40.0000 mg | ORAL_TABLET | Freq: Every day | ORAL | 2 refills | Status: AC

## 2016-09-14 MED ORDER — ATORVASTATIN CALCIUM 40 MG PO TABS: 40.0000 mg | ORAL_TABLET | Freq: Every day | ORAL | 2 refills | Status: DC

## 2016-09-14 MED ORDER — ALPRAZOLAM 0.5 MG PO TABS: 0.2500 mg | ORAL_TABLET | Freq: Two times a day (BID) | ORAL | 1 refills | Status: AC | PRN

## 2016-09-14 MED ORDER — ATENOLOL 50 MG PO TABS: 50.0000 mg | ORAL_TABLET | Freq: Every day | ORAL | 2 refills | Status: AC

## 2016-09-14 NOTE — Telephone Encounter (Signed)
Faxed hardcopy to [facility]

## 2016-11-05 ENCOUNTER — Ambulatory Visit: Payer: Self-pay | Admitting: Family Medicine

## 2016-11-05 NOTE — Progress Notes (Addendum)
Subjective:   [name] is a 75 y.o. female who presents for Medicare Annual (Subsequent) preventive examination.  The Patient was informed that the wellness visit is to identify future health risk and educate and initiate measures that can reduce risk for increased disease through the lifespan.   Describes health as fair, good or great? "good"  Review of Systems:  No ROS.  Medicare Wellness Visit.  Cardiac Risk Factors include: advanced age (>30mn, >>52women);dyslipidemia;hypertension;obesity (BMI >30kg/m2)   Sleep patterns:  Problems falling asleep, will occasionally take Benadryl. Sleeps about 5-6 hours.  Home Safety/Smoke Alarms:  Smoke and Co2 detectors in place.  Living environment; residence and Firearm Safety: Lives with husband and pets in 1 story home. Firearms locked away. Feels safe in home.  Seat Belt Safety/Bike Helmet: Wears seat belt.    Counseling:   Eye Exam-Last exam 2014.  [facility]  Dental-Last gum exam 2016, full dentures.   Female:   Pap-02/18/2010.        Mammo-04/27/16, negative.       Dexa scan-02/11/10, osteopenia. Declines further testing.     CCS-Per patient, colonoscopy at [facility] in 2013. Recall 10 years. Will obtain records.       Objective:     Vitals: BP (!) 152/88 (BP Location: Right Arm, Patient Position: Sitting, Cuff Size: Normal)   Pulse (!) 57   Ht '5\' 3"'$  (1.6 m)   LMP 11/23/1993   SpO2 95%   There is no height or weight on file to calculate BMI.   Tobacco History  Smoking Status  . Former Smoker  . Quit date: 03/02/2009  Smokeless Tobacco  . Never Used     Counseling given: No   Past Medical History:  Diagnosis Date  . ANEMIA 08/23/2008  . Anxiety 09/21/2012  . Arthritis 11/25/2011  . BACK PAIN, LUMBAR, CHRONIC 02/18/2010  . Cerumen impaction 11/25/2011  . CORONARY ARTERY DISEASE 08/23/2008  . Cystic acne 11/04/2012  . Ganglion cyst 11/04/2012   Right forearm and  left wrist.  . GERD 08/23/2008  . HTN (hypertension) 09/21/2012  . Hyperglycemia 08/05/2012  . HYPERLIPIDEMIA 08/23/2008  . HYPOTHYROIDISM 08/23/2008  . Kidney stone 08/12/2013  . OSTEOPENIA 08/23/2008  . Preventative health care 08/05/2012   [facility] [place] she reports being told she did not need another test for 10 years Sees dermatologist, Dr PVito Berger . Vitamin D deficiency 04/28/2015   Past Surgical History:  Procedure Laterality Date  . [place] . CHOLECYSTECTOMY    . CORONARY STENT PLACEMENT  08/2010   Family History  Problem Relation Age of Onset  . Dementia Mother   . Arthritis Mother     rheumatoid  . Heart disease Mother     chf  . Osteoporosis Mother     broken hip  . Heart disease Father     massive MI  . Heart attack Father   . Hyperlipidemia Sister   . Hyperlipidemia Brother   . Heart disease Daughter     2 MIs, Pulmonic Valve disease  . Hyperlipidemia Daughter   . Hyperlipidemia Son   . Hypertension Son   . Cancer Maternal Grandmother     gyn  . Heart disease Maternal Grandmother     CHF  . Alcohol abuse Paternal Grandfather   . Dementia Paternal Grandfather   . Cancer Brother  brain tumor  . Cancer Maternal Aunt     breast   History  Sexual Activity  . Sexual activity: Not on file    Outpatient Encounter Prescriptions as of 11/06/2016  Medication Sig  . acyclovir (ZOVIRAX) 400 MG tablet Take 1 tablet by mouth two  times daily x 25 days.Then take 5 tablets daily x 5 days. (Patient taking differently: 400 mg 2 (two) times daily. )  . ALPRAZolam (XANAX) 0.5 MG tablet Take 0.5 tablets (0.25 mg total) by mouth 2 (two) times daily as needed for sleep or anxiety.  Marland Kitchen aspirin 81 MG tablet Take 81 mg by mouth daily.    Marland Kitchen atenolol (TENORMIN) 50 MG tablet Take 1 tablet (50 mg total) by mouth daily.  Marland Kitchen atorvastatin (LIPITOR) 40 MG tablet Take 1 tablet (40 mg total) by mouth  daily.  . fish oil-omega-3 fatty acids 1000 MG capsule Take 2 g by mouth daily.    Marland Kitchen HYDROcodone-acetaminophen (NORCO) 10-325 MG tablet Take 1 tablet by mouth every 8 (eight) hours as needed.  Marland Kitchen lisinopril (PRINIVIL,ZESTRIL) 10 MG tablet TAKE 1 TABLET BY MOUTH TWO  TIMES DAILY (Patient taking differently: once daily)  . nitroGLYCERIN (NITROSTAT) 0.4 MG SL tablet Place 1 tablet (0.4 mg total) under the tongue every 5 (five) minutes as needed for chest pain.  . prednisoLONE acetate (PRED FORTE) 1 % ophthalmic suspension Place 1 drop into the right eye daily as needed.  . verapamil (CALAN) 40 MG tablet Take 1 tablet (40 mg total) by mouth daily.  Marland Kitchen ampicillin (PRINCIPEN) 500 MG capsule   . [DISCONTINUED] acyclovir (ZOVIRAX) 400 MG tablet Take 1 tablet by mouth two  times a day  . [DISCONTINUED] acyclovir (ZOVIRAX) 400 MG tablet TAKE 1 TABLET BY MOUTH TWO  TIMES A DAY  . [DISCONTINUED] Krill Oil CAPS Take 1 capsule by mouth 1 day or 1 dose.   No facility-administered encounter medications on file as of 11/06/2016.     Activities of Daily Living In your present state of health, do you have any difficulty performing the following activities: 11/06/2016 05/05/2016  Hearing? N Y  Vision? N Y  Difficulty concentrating or making decisions? N N  Walking or climbing stairs? N N  Dressing or bathing? N N  Doing errands, shopping? N N  Preparing Food and eating ? N -  Using the Toilet? N -  In the past six months, have you accidently leaked urine? N -  Do you have problems with loss of bowel control? N -  Managing your Medications? N -  Managing your Finances? N -  Housekeeping or managing your Housekeeping? N -  Some recent data might be hidden    Patient Care Team: Ma Hillock, DO as PCP - General (Family Medicine) Augusto Garbe, MD as Referring Physician (Cardiology) Rosana Berger, MD as Referring Physician (Gastroenterology) Vernie Ammons, MD as Referring Physician  (Dermatology) [place] (Optometry)    Assessment:    Physical assessment deferred to PCP.  Exercise Activities and Dietary recommendations Exercise limited by: None identified   Diet (meal preparation, eat out, water intake, caffeinated beverages, dairy products, fruits and vegetables):  Eats at home. Drinks milk and water  Breakfast: Coffee only. Occasional smoothie. Typically skips.  Lunch: sandwich (egg salad) Dinner: casseroles, meat, vegetable, occasional fried, salad.   Discussed importance of eating at least 3 meals/day. Encouraged to increase water intake and activity. Discussed starting Silver Sneakers program again.   Goals    .  activity          Would like to increase walking to strengthen legs.       Fall Risk Fall Risk  11/06/2016 05/05/2016 10/25/2013  Falls in the past year? No No No   Depression Screen PHQ 2/9 Scores 11/06/2016 05/05/2016 10/25/2013  PHQ - 2 Score 0 0 0     Cognitive Function       Ad8 score reviewed for issues:  Issues making decisions:no  Less interest in hobbies / activities:no  Repeats questions, stories (family complaining):no  Trouble using ordinary gadgets (microwave, computer, phone):no  Forgets the month or year: no  Mismanaging finances: no  Remembering appts:no  Daily problems with thinking and/or memory: no Ad8 score is=0     Immunization History  Administered Date(s) Administered  . Influenza Split 09/28/2011, 08/05/2012  . Influenza Whole 08/23/2008  . Influenza, High Dose Seasonal PF 08/26/2016  . Influenza,inj,Quad PF,36+ Mos 08/08/2013, 06/23/2014  . Pneumococcal Conjugate-13 05/05/2016  . Pneumococcal Polysaccharide-23 08/23/2008  . Tdap 11/25/2011  . Zoster 02/18/2010   Screening Tests Health Maintenance  Topic Date Due  . MAMMOGRAM  04/27/2018  . COLONOSCOPY  11/23/2021  . TETANUS/TDAP  11/24/2021  . INFLUENZA VACCINE  Completed  . DEXA SCAN  Completed  .  ZOSTAVAX  Completed  . PNA vac Low Risk Adult  Completed      Plan:     Eat heart healthy diet (full of fruits, vegetables, whole grains, lean protein, water--limit salt, fat, and sugar intake) and increase physical activity as tolerated.  Continue doing brain stimulating activities (puzzles, reading, adult coloring books, staying active) to keep memory sharp.   Bring a copy of your advance directives to your next office visit.  Schedule eye appointment.    During the course of the visit the patient was educated and counseled about the following appropriate screening and preventive services:   Vaccines to include Pneumoccal, Influenza, Hepatitis B, Td, Zostavax, HCV  Cardiovascular Disease  Colorectal cancer screening  Bone density screening  Diabetes screening  Glaucoma screening  Mammography/PAP  Nutrition counseling   Patient Instructions (the written plan) was given to the patient.   Gerilyn Nestle, RN  11/06/2016    Medical screening examination/treatment/procedure(s) were performed by non-physician practitioner and as supervising physician I was immediately available for consultation/collaboration.  I agree with above assessment and plan.  Electronically Signed by: Howard Pouch, DO [place] [facility] [place]

## 2016-11-05 NOTE — Progress Notes (Signed)
Pre visit review using our clinic review tool, if applicable. No additional management support is needed unless otherwise documented below in the visit note. 

## 2016-11-06 ENCOUNTER — Ambulatory Visit (INDEPENDENT_AMBULATORY_CARE_PROVIDER_SITE_OTHER): Payer: Medicare Other | Admitting: Family Medicine

## 2016-11-06 ENCOUNTER — Encounter: Payer: Self-pay | Admitting: Family Medicine

## 2016-11-06 VITALS — BP 152/88 | HR 57 | Temp 97.7°F | Resp 20 | Ht 63.0 in | Wt 190.5 lb

## 2016-11-06 DIAGNOSIS — I471 Supraventricular tachycardia, unspecified: Secondary | ICD-10-CM

## 2016-11-06 DIAGNOSIS — M8580 Other specified disorders of bone density and structure, unspecified site: Secondary | ICD-10-CM | POA: Diagnosis not present

## 2016-11-06 DIAGNOSIS — I10 Essential (primary) hypertension: Secondary | ICD-10-CM

## 2016-11-06 DIAGNOSIS — G8929 Other chronic pain: Secondary | ICD-10-CM

## 2016-11-06 DIAGNOSIS — Z7689 Persons encountering health services in other specified circumstances: Secondary | ICD-10-CM | POA: Diagnosis not present

## 2016-11-06 DIAGNOSIS — F419 Anxiety disorder, unspecified: Secondary | ICD-10-CM

## 2016-11-06 DIAGNOSIS — M545 Low back pain: Secondary | ICD-10-CM

## 2016-11-06 DIAGNOSIS — Z0000 Encounter for general adult medical examination without abnormal findings: Secondary | ICD-10-CM | POA: Diagnosis not present

## 2016-11-06 DIAGNOSIS — I2510 Atherosclerotic heart disease of native coronary artery without angina pectoris: Secondary | ICD-10-CM

## 2016-11-06 DIAGNOSIS — E782 Mixed hyperlipidemia: Secondary | ICD-10-CM

## 2016-11-06 DIAGNOSIS — I739 Peripheral vascular disease, unspecified: Secondary | ICD-10-CM

## 2016-11-06 MED ORDER — LISINOPRIL 10 MG PO TABS: 10.0000 mg | ORAL_TABLET | Freq: Every day | ORAL | 0 refills | Status: DC

## 2016-11-06 NOTE — Progress Notes (Signed)
Patient ID: [name], female  DOB: [birth year], 75 y.o.   MRN: [id number] Patient Care Team    Relationship Specialty Notifications Start End  Ma Hillock, DO PCP - General Family Medicine  11/06/16   Augusto Garbe, MD Referring Physician Cardiology  11/06/16   Rosana Berger, MD Referring Physician Gastroenterology  11/06/16   Vernie Ammons, MD Referring Physician Dermatology  11/06/16   [facility]  Optometry  11/06/16   Paralee Cancel, MD Consulting Physician Orthopedic Surgery  11/06/16   Ernst Spell, MD Referring Physician Urology  11/06/16     Subjective:  [name] is a 75 y.o.  female present for new patient establishment (transfer).  All past medical history, surgical history, allergies, family history, immunizations, medications and social history were updated  in the electronic medical record today. All recent labs, ED visits and hospitalizations within the last year were reviewed.  Patient has acute complaints of back pain of 2 weeks. She states it is nothing that is all that bad and is a level of 2/10 at its worse. It is just new or pain that hurts with movement on the left upper lateral aspect of her back. She has chronic back pain and chronic pain. She was/is prescribed narcotics by her prior provider. She is also on a benzodiazepine, Xanax, which she states she takes very infrequently for anxiety.  Patient also complains of an episode of dizziness and neck pain. Patient states she was changing the sheets on her bed approximately one week ago when she had a flash of left-sided neck pain, with dizziness. She states she sat down, it quickly resolved and has not recurred since. She denies any associated factors with that condition.  She has a chronic medical history of hypertension, coronary artery disease, coronary stent placement, peripheral vascular disease with mild carotid narrowing, paroxysmal SVT , hypothyroidism and  hyperlipidemia. She reports compliance with verapamil 40 mg daily, lisinopril 10 mg daily, 2 g of fish oil supplementation daily, Lipitor 40 mg daily, atenolol 50 mg daily and baby aspirin daily. She follows with Dr. Matthew Saras cardiology every 6 months.   Depression screen [facility] 2/9 11/06/2016 05/05/2016 10/25/2013  Decreased Interest 0 0 0  Down, Depressed, Hopeless 0 0 0  PHQ - 2 Score 0 0 0    Health maintenance:  Colonoscopy: completed 2013, per pt 10 year f/u  Mammogram: completed:04/2016, birads 1.  Cervical cancer screening: last pap: 2011, > 65 not indicated.  Immunizations: tdap 11/2011, Influenza 08/2016 (encouraged yearly), PNA series completed 2009/2017, zostavax completed 01/2010 DEXA: last completed 2011, result osteopenia (-2.3), declines further testing.    Immunization History  Administered Date(s) Administered  . Influenza Split 09/28/2011, 08/05/2012  . Influenza Whole 08/23/2008  . Influenza, High Dose Seasonal PF 08/26/2016  . Influenza,inj,Quad PF,36+ Mos 08/08/2013, 06/23/2014  . Pneumococcal Conjugate-13 05/05/2016  . Pneumococcal Polysaccharide-23 08/23/2008  . Tdap 11/25/2011  . Zoster 02/18/2010     Past Medical History:  Diagnosis Date  . ANEMIA 08/23/2008  . Anxiety 09/21/2012  . Arthritis 11/25/2011  . BACK PAIN, LUMBAR, CHRONIC 02/18/2010  . CORONARY ARTERY DISEASE 08/23/2008  . Cystic acne 11/04/2012  . Ganglion cyst 11/04/2012   Right forearm and left wrist.  . GERD 08/23/2008  . HTN (hypertension) 09/21/2012  . HYPERLIPIDEMIA 08/23/2008  . HYPOTHYROIDISM 08/23/2008  . Kidney stone 08/12/2013  . OSTEOPENIA 08/23/2008   declines further studies  . Vitamin D deficiency 04/28/2015  No Known Allergies Past Surgical History:  Procedure Laterality Date  . [place]  . CHOLECYSTECTOMY    . CORONARY STENT PLACEMENT  08/2010   Family History  Problem Relation Age of Onset  . Dementia Mother   . Arthritis Mother     rheumatoid  . Heart  disease Mother     chf  . Osteoporosis Mother     broken hip  . Heart disease Father     massive MI  . Heart attack Father   . Hyperlipidemia Sister   . Hyperlipidemia Brother   . Heart disease Daughter     2 MIs, Pulmonic Valve disease  . Hyperlipidemia Daughter   . Hyperlipidemia Son   . Hypertension Son   . Cancer Maternal Grandmother     gyn  . Heart disease Maternal Grandmother     CHF  . Alcohol abuse Paternal Grandfather   . Dementia Paternal Grandfather   . Brain cancer Brother     brain tumor  . Breast cancer Maternal Aunt     breast   Social History   Social History  . Marital status: Married    Spouse name: N/A  . Number of children: N/A  . Years of education: N/A   Occupational History  . Not on file.   Social History Main Topics  . Smoking status: Former Smoker    Quit date: 03/02/2009  . Smokeless tobacco: Never Used  . Alcohol use Yes     Comment: very rarely  . Drug use: No  . Sexual activity: No   Other Topics Concern  . Not on file   Social History Narrative   Married.    Allergies as of 11/06/2016   No Known Allergies     Medication List       Accurate as of 11/06/16  8:12 PM. Always use your most recent med list.          acyclovir 400 MG tablet Commonly known as:  ZOVIRAX Take 1 tablet by mouth two  times daily x 25 days.Then take 5 tablets daily x 5 days.   ALPRAZolam 0.5 MG tablet Commonly known as:  XANAX Take 0.5 tablets (0.25 mg total) by mouth 2 (two) times daily as needed for sleep or anxiety.   ampicillin 500 MG capsule Commonly known as:  PRINCIPEN   aspirin 81 MG tablet Take 81 mg by mouth daily.   atenolol 50 MG tablet Commonly known as:  TENORMIN Take 1 tablet (50 mg total) by mouth daily.   atorvastatin 40 MG tablet Commonly known as:  LIPITOR Take 1 tablet (40 mg total) by mouth daily.   fish oil-omega-3 fatty acids 1000 MG capsule Take 2 g by mouth daily.   HYDROcodone-acetaminophen 10-325 MG  tablet Commonly known as:  NORCO Take 1 tablet by mouth every 8 (eight) hours as needed.   lisinopril 10 MG tablet Commonly known as:  PRINIVIL,ZESTRIL Take 1 tablet (10 mg total) by mouth daily.   nitroGLYCERIN 0.4 MG SL tablet Commonly known as:  NITROSTAT Place 1 tablet (0.4 mg total) under the tongue every 5 (five) minutes as needed for chest pain.   prednisoLONE acetate 1 % ophthalmic suspension Commonly known as:  PRED FORTE Place 1 drop into the right eye daily as needed.   verapamil 40 MG tablet Commonly known as:  CALAN Take 1 tablet (40 mg total) by mouth daily.        No results found for  this or any previous visit (from the past 2160 hour(s)).  Mm Screening Breast Tomo Bilateral  Result Date: 04/28/2016 CLINICAL DATA:  Screening. EXAM: 2D DIGITAL SCREENING BILATERAL MAMMOGRAM WITH CAD AND ADJUNCT TOMO COMPARISON:  Previous exam(s). ACR Breast Density Category b: There are scattered areas of fibroglandular density. FINDINGS: There are no findings suspicious for malignancy. Images were processed with CAD. IMPRESSION: No mammographic evidence of malignancy. A result letter of this screening mammogram will be mailed directly to the patient. RECOMMENDATION: Screening mammogram in one year. (Code:SM-B-01Y) BI-RADS CATEGORY  1: Negative. Electronically Signed   By: Evangeline Dakin M.D.   On: 04/28/2016 13:34   ROS: 14 pt review of systems performed and negative (unless mentioned in an HPI)  Objective: BP (!) 152/88 (BP Location: Right Arm, Patient Position: Sitting, Cuff Size: Normal)   Pulse (!) 57   Temp 97.7 F (36.5 C)   Resp 20   Ht '5\' 3"'$  (1.6 m)   Wt 190 lb 8 oz (86.4 kg) Comment: Refused  LMP 11/23/1993   SpO2 95%   BMI 33.75 kg/m  Gen: Afebrile. No acute distress. Nontoxic in appearance, well-developed, well-nourished,  pleasant Caucasian female. HENT: AT. [place].  MMM Eyes:Pupils Equal Round Reactive to light, Extraocular movements intact,  Conjunctiva without  redness, discharge or icterus. CV: RRR 1/6 systolic murmur present, no edema, +2/4 P posterior tibialis pulses. Bilateral carotid bruits appreciated. No JVD. Chest: CTAB, no wheeze, rhonchi or crackles.  Abd: Soft.NTND. BS present  Skin: Warm and well-perfused. Skin intact. Neuro/Msk: Normal gait. PERLA. EOMi. Alert. Oriented x3.   Psych: Normal affect, dress and demeanor. Normal speech. Normal thought content and judgment.   Assessment/plan: [name] is a 75 y.o. female present for transfer of care. Patient had her Medicare wellness visit with nurse prior to transfer appointment. Encounter for Medicare annual wellness exam - Seen just prior to appointment with nurse came for Medicare wellness  Essential hypertension, benign/coronary artery disease in native artery/hyperlipidemia/PVD (peripheral vascular disease) (HCC)/ Paroxysmal SVT (supraventricular tachycardia) ([place]) Blood pressure just mildly elevated today, patient reports compliance with medication. Discussed with her she is to monitor her blood pressure home, consistently above 140/80 would need to see her sooner. She is meeting a new provider today, might be some whitecoat or anxiety involved. -She does continue current regimen of verapamil 40 mg daily, lisinopril 10 mg daily, 2 g fish oil supplementation, Lipitor 40 mg daily, atenolol 50 mg daily and daily baby aspirin. -She is to continue every 6 months follow-up with her cardiologist, Dr. Matthew Saras. Reviewed recent echocardiogram and carotid Doppler studies.  Osteopenia, unspecified location -Known osteopenia many years ago, mildly low vitamin D on recent check at wellness visit. She has declined any further studies for her bone density. Would encourage appropriate vitamin D supplementation.  Anxiety Patient is prescribed Xanax 0.5 mg, which she states she takes half of a tablet very infrequently. Patient did not asked for refills on this medication today. If she does require  refills from this provider, she would need to make an appointment to discuss her anxiety and benzodiazepine use. Would not encourage benzodiazepine with narcotic, as she has been prescribed in the past. By her report she takes both of these very infrequently.  Chronic low back pain, unspecified back pain laterality, with sciatica presence unspecified - Patient with prior pain prescription of Norco 10-3 25 provided to her by her prior provider for chronic low back pain/arthritis. She did not asked for refills on this prescription  today. - Patient will need to be seen for chronic pain management, if she needs refills on this medication. - Chronic pain management appointments should be scheduled routinely, with no other acute or chronic issues involved during chronic pain management.  Patient to musculoskeletal acute complaints. Briefly discussed them with her neither of which seemed to be urgent or sustaining any symptoms. Considering this is a new patient establishment with multiple chronic issues to discuss I encouraged her to follow-up for her acute complaints if they worsen for her. She was agreeable to this.  Return in about 6 months (around 05/07/2017), or hTN/chronic medical conditions..  Physical with labs completed in June Medicare wellness one year  Electronically signed by: Howard Pouch, DO [place]

## 2016-11-06 NOTE — Patient Instructions (Addendum)
Eat heart healthy diet (full of fruits, vegetables, whole grains, lean protein, water--limit salt, fat, and sugar intake) and increase physical activity as tolerated.  Continue doing brain stimulating activities (puzzles, reading, adult coloring books, staying active) to keep memory sharp.   Bring a copy of your advance directives to your next office visit.  Schedule eye appointment.    Fall Prevention in the Home Introduction Falls can cause injuries. They can happen to people of all ages. There are many things you can do to make your home safe and to help prevent falls. What can I do on the outside of my home?  Regularly fix the edges of walkways and driveways and fix any cracks.  Remove anything that might make you trip as you walk through a door, such as a raised step or threshold.  Trim any bushes or trees on the path to your home.  Use bright outdoor lighting.  Clear any walking paths of anything that might make someone trip, such as rocks or tools.  Regularly check to see if handrails are loose or broken. Make sure that both sides of any steps have handrails.  Any raised decks and porches should have guardrails on the edges.  Have any leaves, snow, or ice cleared regularly.  Use sand or salt on walking paths during winter.  Clean up any spills in your garage right away. This includes oil or grease spills. What can I do in the bathroom?  Use night lights.  Install grab bars by the toilet and in the tub and shower. Do not use towel bars as grab bars.  Use non-skid mats or decals in the tub or shower.  If you need to sit down in the shower, use a plastic, non-slip stool.  Keep the floor dry. Clean up any water that spills on the floor as soon as it happens.  Remove soap buildup in the tub or shower regularly.  Attach bath mats securely with double-sided non-slip rug tape.  Do not have throw rugs and other things on the floor that can make you trip. What can I do  in the bedroom?  Use night lights.  Make sure that you have a light by your bed that is easy to reach.  Do not use any sheets or blankets that are too big for your bed. They should not hang down onto the floor.  Have a firm chair that has side arms. You can use this for support while you get dressed.  Do not have throw rugs and other things on the floor that can make you trip. What can I do in the kitchen?  Clean up any spills right away.  Avoid walking on wet floors.  Keep items that you use a lot in easy-to-reach places.  If you need to reach something above you, use a strong step stool that has a grab bar.  Keep electrical cords out of the way.  Do not use floor polish or wax that makes floors slippery. If you must use wax, use non-skid floor wax.  Do not have throw rugs and other things on the floor that can make you trip. What can I do with my stairs?  Do not leave any items on the stairs.  Make sure that there are handrails on both sides of the stairs and use them. Fix handrails that are broken or loose. Make sure that handrails are as long as the stairways.  Check any carpeting to make sure that it is firmly  attached to the stairs. Fix any carpet that is loose or worn.  Avoid having throw rugs at the top or bottom of the stairs. If you do have throw rugs, attach them to the floor with carpet tape.  Make sure that you have a light switch at the top of the stairs and the bottom of the stairs. If you do not have them, ask someone to add them for you. What else can I do to help prevent falls?  Wear shoes that:  Do not have high heels.  Have rubber bottoms.  Are comfortable and fit you well.  Are closed at the toe. Do not wear sandals.  If you use a stepladder:  Make sure that it is fully opened. Do not climb a closed stepladder.  Make sure that both sides of the stepladder are locked into place.  Ask someone to hold it for you, if possible.  Clearly mark  and make sure that you can see:  Any grab bars or handrails.  First and last steps.  Where the edge of each step is.  Use tools that help you move around (mobility aids) if they are needed. These include:  Canes.  Walkers.  Scooters.  Crutches.  Turn on the lights when you go into a dark area. Replace any light bulbs as soon as they burn out.  Set up your furniture so you have a clear path. Avoid moving your furniture around.  If any of your floors are uneven, fix them.  If there are any pets around you, be aware of where they are.  Review your medicines with your doctor. Some medicines can make you feel dizzy. This can increase your chance of falling. Ask your doctor what other things that you can do to help prevent falls. This information is not intended to replace advice given to you by your health care provider. Make sure you discuss any questions you have with your health care provider. Document Released: 09/05/2009 Document Revised: 04/16/2016 Document Reviewed: 12/14/2014  2017 [employer]  Health Maintenance, Female Introduction Adopting a healthy lifestyle and getting preventive care can go a long way to promote health and wellness. Talk with your health care provider about what schedule of regular examinations is right for you. This is a good chance for you to check in with your provider about disease prevention and staying healthy. In between checkups, there are plenty of things you can do on your own. Experts have done a lot of research about which lifestyle changes and preventive measures are most likely to keep you healthy. Ask your health care provider for more information. Weight and diet Eat a healthy diet  Be sure to include plenty of vegetables, fruits, low-fat dairy products, and lean protein.  Do not eat a lot of foods high in solid fats, added sugars, or salt.  Get regular exercise. This is one of the most important things you can do for your  health.  Most adults should exercise for at least 150 minutes each week. The exercise should increase your heart rate and make you sweat (moderate-intensity exercise).  Most adults should also do strengthening exercises at least twice a week. This is in addition to the moderate-intensity exercise. Maintain a healthy weight  Body mass index (BMI) is a measurement that can be used to identify possible weight problems. It estimates body fat based on height and weight. Your health care provider can help determine your BMI and help you achieve or maintain a healthy weight.  For females 52 years of age and older:  A BMI below 18.5 is considered underweight.  A BMI of 18.5 to 24.9 is normal.  A BMI of 25 to 29.9 is considered overweight.  A BMI of 30 and above is considered obese. Watch levels of cholesterol and blood lipids  You should start having your blood tested for lipids and cholesterol at 73 years of age, then have this test every 5 years.  You may need to have your cholesterol levels checked more often if:  Your lipid or cholesterol levels are high.  You are older than 73 years of age.  You are at high risk for heart disease. Cancer screening Lung Cancer  Lung cancer screening is recommended for adults 83-24 years old who are at high risk for lung cancer because of a history of smoking.  A yearly low-dose CT scan of the lungs is recommended for people who:  Currently smoke.  Have quit within the past 15 years.  Have at least a 30-pack-year history of smoking. A pack year is smoking an average of one pack of cigarettes a day for 1 year.  Yearly screening should continue until it has been 15 years since you quit.  Yearly screening should stop if you develop a health problem that would prevent you from having lung cancer treatment. Breast Cancer  Practice breast self-awareness. This means understanding how your breasts normally appear and feel.  It also means doing  regular breast self-exams. Let your health care provider know about any changes, no matter how small.  If you are in your 20s or 30s, you should have a clinical breast exam (CBE) by a health care provider every 1-3 years as part of a regular health exam.  If you are 20 or older, have a CBE every year. Also consider having a breast X-ray (mammogram) every year.  If you have a family history of breast cancer, talk to your health care provider about genetic screening.  If you are at high risk for breast cancer, talk to your health care provider about having an MRI and a mammogram every year.  Breast cancer gene (BRCA) assessment is recommended for women who have family members with BRCA-related cancers. BRCA-related cancers include:  Breast.  Ovarian.  Tubal.  Peritoneal cancers.  Results of the assessment will determine the need for genetic counseling and BRCA1 and BRCA2 testing. Cervical Cancer  Your health care provider may recommend that you be screened regularly for cancer of the pelvic organs (ovaries, uterus, and vagina). This screening involves a pelvic examination, including checking for microscopic changes to the surface of your cervix (Pap test). You may be encouraged to have this screening done every 3 years, beginning at age 9.  For women ages 81-65, health care providers may recommend pelvic exams and Pap testing every 3 years, or they may recommend the Pap and pelvic exam, combined with testing for human papilloma virus (HPV), every 5 years. Some types of HPV increase your risk of cervical cancer. Testing for HPV may also be done on women of any age with unclear Pap test results.  Other health care providers may not recommend any screening for nonpregnant women who are considered low risk for pelvic cancer and who do not have symptoms. Ask your health care provider if a screening pelvic exam is right for you.  If you have had past treatment for cervical cancer or a condition  that could lead to cancer, you need Pap tests and screening  for cancer for at least 20 years after your treatment. If Pap tests have been discontinued, your risk factors (such as having a new sexual partner) need to be reassessed to determine if screening should resume. Some women have medical problems that increase the chance of getting cervical cancer. In these cases, your health care provider may recommend more frequent screening and Pap tests. Colorectal Cancer  This type of cancer can be detected and often prevented.  Routine colorectal cancer screening usually begins at 73 years of age and continues through 73 years of age.  Your health care provider may recommend screening at an earlier age if you have risk factors for colon cancer.  Your health care provider may also recommend using home test kits to check for hidden blood in the stool.  A small camera at the end of a tube can be used to examine your colon directly (sigmoidoscopy or colonoscopy). This is done to check for the earliest forms of colorectal cancer.  Routine screening usually begins at age 70.  Direct examination of the colon should be repeated every 5-10 years through 73 years of age. However, you may need to be screened more often if early forms of precancerous polyps or small growths are found. Skin Cancer  Check your skin from head to toe regularly.  Tell your health care provider about any new moles or changes in moles, especially if there is a change in a mole's shape or color.  Also tell your health care provider if you have a mole that is larger than the size of a pencil eraser.  Always use sunscreen. Apply sunscreen liberally and repeatedly throughout the day.  Protect yourself by wearing long sleeves, pants, a wide-brimmed hat, and sunglasses whenever you are outside. Heart disease, diabetes, and high blood pressure  High blood pressure causes heart disease and increases the risk of stroke. High blood  pressure is more likely to develop in:  People who have blood pressure in the high end of the normal range (130-139/85-89 mm Hg).  People who are overweight or obese.  People who are African American.  If you are 12-87 years of age, have your blood pressure checked every 3-5 years. If you are 75 years of age or older, have your blood pressure checked every year. You should have your blood pressure measured twice-once when you are at a hospital or clinic, and once when you are not at a hospital or clinic. Record the average of the two measurements. To check your blood pressure when you are not at a hospital or clinic, you can use:  An automated blood pressure machine at a pharmacy.  A home blood pressure monitor.  If you are between 79 years and 24 years old, ask your health care provider if you should take aspirin to prevent strokes.  Have regular diabetes screenings. This involves taking a blood sample to check your fasting blood sugar level.  If you are at a normal weight and have a low risk for diabetes, have this test once every three years after 73 years of age.  If you are overweight and have a high risk for diabetes, consider being tested at a younger age or more often. Preventing infection Hepatitis B  If you have a higher risk for hepatitis B, you should be screened for this virus. You are considered at high risk for hepatitis B if:  You were born in a country where hepatitis B is common. Ask your health care provider which  countries are considered high risk.  Your parents were born in a high-risk country, and you have not been immunized against hepatitis B (hepatitis B vaccine).  You have HIV or AIDS.  You use needles to inject street drugs.  You live with someone who has hepatitis B.  You have had sex with someone who has hepatitis B.  You get hemodialysis treatment.  You take certain medicines for conditions, including cancer, organ transplantation, and autoimmune  conditions. Hepatitis C  Blood testing is recommended for:  Everyone born from [birth year] through 1963.  Anyone with known risk factors for hepatitis C. Sexually transmitted infections (STIs)  You should be screened for sexually transmitted infections (STIs) including gonorrhea and chlamydia if:  You are sexually active and are younger than 73 years of age.  You are older than 73 years of age and your health care provider tells you that you are at risk for this type of infection.  Your sexual activity has changed since you were last screened and you are at an increased risk for chlamydia or gonorrhea. Ask your health care provider if you are at risk.  If you do not have HIV, but are at risk, it may be recommended that you take a prescription medicine daily to prevent HIV infection. This is called pre-exposure prophylaxis (PrEP). You are considered at risk if:  You are sexually active and do not regularly use condoms or know the HIV status of your partner(s).  You take drugs by injection.  You are sexually active with a partner who has HIV. Talk with your health care provider about whether you are at high risk of being infected with HIV. If you choose to begin PrEP, you should first be tested for HIV. You should then be tested every 3 months for as long as you are taking PrEP. Pregnancy  If you are premenopausal and you may become pregnant, ask your health care provider about preconception counseling.  If you may become pregnant, take 400 to 800 micrograms (mcg) of folic acid every day.  If you want to prevent pregnancy, talk to your health care provider about birth control (contraception). Osteoporosis and menopause  Osteoporosis is a disease in which the bones lose minerals and strength with aging. This can result in serious bone fractures. Your risk for osteoporosis can be identified using a bone density scan.  If you are 83 years of age or older, or if you are at risk for  osteoporosis and fractures, ask your health care provider if you should be screened.  Ask your health care provider whether you should take a calcium or vitamin D supplement to lower your risk for osteoporosis.  Menopause may have certain physical symptoms and risks.  Hormone replacement therapy may reduce some of these symptoms and risks. Talk to your health care provider about whether hormone replacement therapy is right for you. Follow these instructions at home:  Schedule regular health, dental, and eye exams.  Stay current with your immunizations.  Do not use any tobacco products including cigarettes, chewing tobacco, or electronic cigarettes.  If you are pregnant, do not drink alcohol.  If you are breastfeeding, limit how much and how often you drink alcohol.  Limit alcohol intake to no more than 1 drink per day for nonpregnant women. One drink equals 12 ounces of beer, 5 ounces of wine, or 1 ounces of hard liquor.  Do not use street drugs.  Do not share needles.  Ask your health care provider for  help if you need support or information about quitting drugs.  Tell your health care provider if you often feel depressed.  Tell your health care provider if you have ever been abused or do not feel safe at home. This information is not intended to replace advice given to you by your health care provider. Make sure you discuss any questions you have with your health care provider. Document Released: 05/25/2011 Document Revised: 04/16/2016 Document Reviewed: 08/13/2015  2017 [employer]    Please help [place] help you:  It is a privilege to be able to take care of great patients such as yourself. We are honored you have chosen [facility] for your Primary Care home. Below you will find basic instructions that you may need to access in the future. Please help [place] help you by reading the instructions, which cover many of the frequent questions we experience.   Prescription refills  and request:  -In order to allow more efficient response time, please call your pharmacy for all refills. They will forward the request electronically to [place]. This allows for the quickest possible response. Request left on a nurse line can take longer to refill, since these are checked as time allows between office patients and other phone calls.  - refill request can take up to 3-5 working days to complete.  - If request is sent electronically and request is appropiate, it is usually completed in 1-2 business days.  - all patients will need to be seen routinely for all chronic medical conditions requiring prescription medications (see follow-up below). If you are overdue for follow up on your condition, you will be asked to make an appointment and we will call in enough medication to cover you until your appointment (up to 30 days).  - all controlled substances will require a face to face visit to request/refill.  - if you desire your prescriptions to go through a new pharmacy, and have an active script at original pharmacy, you will need to call your pharmacy and have scripts transferred to new pharmacy. This is completed between the pharmacy locations and not by your provider.    Results: If any images or labs were ordered, it can take up to 1 week to get results depending on the test ordered and the lab/facility running and resulting the test. - Normal or stable results, which do not need further discussion, will be released to your mychart immediately with attached note to you. A call will not be generated for normal results. Please make certain to sign up for mychart. If you have questions on how to activate your mychart you can call the front office.  - If your results need further discussion, our office will attempt to contact you via phone, and if unable to reach you after 2 attempts, we will release your abnormal result to your mychart with instructions.  - All results will be automatically  released in mychart after 1 week.  - Your provider will provide you with explanation and instruction on all relevant material in your results. Please keep in mind, results and labs may appear confusing or abnormal to the untrained eye, but it does not mean they are actually abnormal for you personally. If you have any questions about your results that are not covered, or you desire more detailed explanation than what was provided, you should make an appointment with your provider to do so.   Our office handles many outgoing and incoming calls daily. If we have not  contacted you within 1 week about your results, please check your mychart to see if there is a message first and if not, then contact our office.  In helping with this matter, you help decrease call volume, and therefore allow [place] to be able to respond to patients needs more efficiently.   Acute office visits (sick visit):  An acute visit is intended for a new problem and are scheduled in shorter time slots to allow schedule openings for patients with new problems. This is the appropriate visit to discuss a new problem. In order to provide you with excellent quality medical care with proper time for you to explain your problem, have an exam and receive treatment with instructions, these appointments should be limited to one new problem per visit. If you experience a new problem, in which you desire to be addressed, please make an acute office visit, we save openings on the schedule to accommodate you. Please do not save your new problem for any other type of visit, let us take care of it properly and quickly for you.   Follow up visits:  Depending on your condition(s) your provider will need to see you routinely in order to provide you with quality care and prescribe medication(s). Most chronic conditions (Example: hypertension, Diabetes, depression/anxiety... etc), require visits a couple times a year. Your provider will instruct you on proper  follow up for your personal medical conditions and history. Please make certain to make follow up appointments for your condition as instructed. Failing to do so could result in lapse in your medication treatment/refills. If you request a refill, and are overdue to be seen on a condition, we will always provide you with a 30 day script (once) to allow you time to schedule.    Medicare wellness (well visit): - we have a wonderful Nurse Maudie Mercury), that will meet with you and provide you will yearly medicare wellness visits. These visits should occur yearly (can not be scheduled less than 1 calendar year apart) and cover preventive health, immunizations, advance directives and screenings you are entitled to yearly through your medicare benefits. Do not miss out on your entitled benefits, this is when medicare will pay for these benefits to be ordered for you.  These are strongly encouraged by your provider and is the appropriate type of visit to make certain you are up to date with all preventive health benefits. If you have not had your medicare wellness exam in the last 12 months, please make certain to schedule one by calling the office and schedule your medicare wellness with Maudie Mercury as soon as possible.   Yearly physical (well visit):  - Adults are recommended to be seen yearly for physicals. Check with your insurance and date of your last physical, most insurances require one calendar year between physicals. Physicals include all preventive health topics, screenings, medical exam and labs that are appropriate for gender/age and history. You may have fasting labs needed at this visit. This is a well visit (not a sick visit), acute topics should not be covered during this visit.  - Pediatric patients are seen more frequently when they are younger. Your provider will advise you on well child visit timing that is appropriate for your their age. - This is not a medicare wellness visit. Medicare wellness exams do not  have an exam portion to the visit. Some medicare companies allow for a physical, some do not allow a yearly physical. If your medicare allows a yearly physical you can  schedule the medicare wellness with our nurse Maudie Mercury and have your physical with your provider after, on the same day. Please check with insurance for your full benefits.   Late Policy/No Shows:  - all new patients should arrive 15-30 minutes earlier than appointment to allow [place] time  to  obtain all personal demographics,  insurance information and for you to complete office paperwork. - All established patients should arrive 10-15 minutes earlier than appointment time to update all information and be checked in .  - In our best efforts to run on time, if you are late for your appointment you will be asked to either reschedule or if able, we will work you back into the schedule. There will be a wait time to work you back in the schedule,  depending on availability.  - If you are unable to make it to your appointment as scheduled, please call 24 hours ahead of time to allow [place] to fill the time slot with someone else who needs to be seen. If you do not cancel your appointment ahead of time, you may be charged a no show fee.

## 2016-12-15 ENCOUNTER — Telehealth: Payer: Self-pay | Admitting: Family Medicine

## 2016-12-15 DIAGNOSIS — E785 Hyperlipidemia, unspecified: Secondary | ICD-10-CM | POA: Diagnosis not present

## 2016-12-15 DIAGNOSIS — I10 Essential (primary) hypertension: Secondary | ICD-10-CM | POA: Diagnosis not present

## 2016-12-15 DIAGNOSIS — I471 Supraventricular tachycardia: Secondary | ICD-10-CM | POA: Diagnosis not present

## 2016-12-15 DIAGNOSIS — I2510 Atherosclerotic heart disease of native coronary artery without angina pectoris: Secondary | ICD-10-CM | POA: Diagnosis not present

## 2016-12-15 DIAGNOSIS — Z955 Presence of coronary angioplasty implant and graft: Secondary | ICD-10-CM | POA: Diagnosis not present

## 2016-12-15 NOTE — Telephone Encounter (Signed)
Called home number left message to call back. Cell number no answer/voice mail not set up yet.

## 2016-12-15 NOTE — Telephone Encounter (Signed)
Called left message to call back 

## 2016-12-15 NOTE — Telephone Encounter (Signed)
Patient called regarding her labs from 04/2016 that Dr. Charlett Blake ordered. She states that she never got a call or anything with the results and neither did her cardiologist. She is requesting primarily that her cardiologist be faxed the results and she would also like to get them herself. Please advise  Phone: [phone] or [phone]

## 2016-12-16 ENCOUNTER — Encounter: Payer: Self-pay | Admitting: *Deleted

## 2016-12-16 NOTE — Telephone Encounter (Signed)
Informed patient of her lab results and the provider's recommendations below.  Notes Recorded by Mosie Lukes, MD on 05/07/2016 at 2:46 PM Labs look good all normal except cholesterol still up. It is much better so for now minimize carbohydrates and fatty foods, stay active and we will recheck at next visit   Patient voiced understanding & requested that the lab results be sent to her Cardiologist as well. Faxed results to [facility] at [facility], Dr. Matthew Saras, [phone].

## 2017-01-25 ENCOUNTER — Telehealth: Payer: Self-pay | Admitting: Family Medicine

## 2017-01-25 NOTE — Telephone Encounter (Signed)
Patient is requesting a X-ray for her back. She states that it rotates around the center of her back with pain.  Please call patient at [phone] to get her scheduled at the appropriate location. Patient is okay with leaving a detailed voicemail on home phone.

## 2017-01-25 NOTE — Telephone Encounter (Signed)
Spoke with patient advised her she would need to be seen for evaluation. Patient verbalized understanding .scheduled patient for an appt.

## 2017-01-26 ENCOUNTER — Ambulatory Visit (INDEPENDENT_AMBULATORY_CARE_PROVIDER_SITE_OTHER): Payer: Medicare Other | Admitting: Family Medicine

## 2017-01-26 ENCOUNTER — Encounter: Payer: Self-pay | Admitting: Family Medicine

## 2017-01-26 VITALS — BP 143/90 | HR 73 | Temp 97.9°F | Resp 20 | Wt 192.8 lb

## 2017-01-26 DIAGNOSIS — M2550 Pain in unspecified joint: Secondary | ICD-10-CM | POA: Diagnosis not present

## 2017-01-26 DIAGNOSIS — M546 Pain in thoracic spine: Secondary | ICD-10-CM | POA: Diagnosis not present

## 2017-01-26 LAB — BASIC METABOLIC PANEL WITH GFR
BUN: 14 mg/dL (ref 7–25)
CALCIUM: 9.7 mg/dL (ref 8.6–10.4)
CO2: 26 mmol/L (ref 20–31)
Chloride: 103 mmol/L (ref 98–110)
Creat: 0.57 mg/dL — ABNORMAL LOW (ref 0.60–0.93)
GFR, Est Non African American: >89 mL/min (ref >=60)
GLUCOSE: 81 mg/dL (ref 65–99)
Potassium: 4.2 mmol/L (ref 3.5–5.3)
Sodium: 138 mmol/L (ref 135–146)

## 2017-01-26 MED ORDER — MELOXICAM 15 MG PO TABS: 15.0000 mg | ORAL_TABLET | Freq: Every day | ORAL | 5 refills | Status: DC

## 2017-01-26 NOTE — Patient Instructions (Signed)
Take Mobic daily with food.  Watch for upset stomach and if you start to notice reflux take an OTC Prilosec daily.   I will call you with labs once available.    Joint Pain Joint pain can be caused by many things. The joint can be bruised, infected, weak from aging, or sore from exercise. The pain will probably go away if you follow your doctor's instructions for home care. If your joint pain continues, more tests may be needed to help find the cause of your condition. Follow these instructions at home: Watch your condition for any changes. Follow these instructions as told to lessen the pain that you are feeling:  Take medicines only as told by your doctor.  Rest the sore joint for as long as told by your doctor. If your doctor tells you to, raise (elevate) the painful joint above the level of your heart while you are sitting or lying down.  Do not do things that cause pain or make the pain worse.  If told, put ice on the painful area:  Put ice in a plastic bag.  Place a towel between your skin and the bag.  Leave the ice on for 20 minutes, 2-3 times per day.  Wear an elastic bandage, splint, or sling as told by your doctor. Loosen the bandage or splint if your fingers or toes lose feeling (become numb) and tingle, or if they turn cold and blue.  Begin exercising or stretching the joint as told by your doctor. Ask your doctor what types of exercise are safe for you.  Keep all follow-up visits as told by your doctor. This is important. Contact a doctor if:  Your pain gets worse and medicine does not help it.  Your joint pain does not get better in 3 days.  You have more bruising or swelling.  You have a fever.  You lose 10 pounds (4.5 kg) or more without trying. Get help right away if:  You are not able to move the joint.  Your fingers or toes become numb or they turn cold and blue. This information is not intended to replace advice given to you by your health care  provider. Make sure you discuss any questions you have with your health care provider. Document Released: 10/28/2009 Document Revised: 04/16/2016 Document Reviewed: 08/21/2014 Elsevier Interactive Patient Education  2017 [employer].

## 2017-01-26 NOTE — Progress Notes (Signed)
[name] , [birth year], 76 y.o., female MRN: [id number] Patient Care Team    Relationship Specialty Notifications Start End  Ma Hillock, DO PCP - General Family Medicine  11/06/16   Augusto Garbe, MD Referring Physician Cardiology  11/06/16   Rosana Berger, MD Referring Physician Gastroenterology  11/06/16   Vernie Ammons, MD Referring Physician Dermatology  11/06/16   [facility]  Optometry  11/06/16   Paralee Cancel, MD Consulting Physician Orthopedic Surgery  11/06/16   Ernst Spell, MD Referring Physician Urology  11/06/16     CC: Back pain Subjective:  Back pain: Pt states she has had left sided thoracic back pain since December. She complains of multiple arthralgias in shoulders, back and hands. She has never been on an NSAID for pain. She is intermittently on a narcotic, but states she just can not tolerate that medicine and does not like to take it. She has deformities on her fingers. She states she does get out and perform heavy gardening in the summer time. She feels like the back pain portion is worsening since December. She has a h/o GERD in the past.    Depression screen [facility] 2/9 11/06/2016 05/05/2016 10/25/2013  Decreased Interest 0 0 0  Down, Depressed, Hopeless 0 0 0  PHQ - 2 Score 0 0 0    No Known Allergies Social History  Substance Use Topics  . Smoking status: Former Smoker    Quit date: 03/02/2009  . Smokeless tobacco: Never Used  . Alcohol use Yes     Comment: very rarely   Past Medical History:  Diagnosis Date  . ANEMIA 08/23/2008  . Anxiety 09/21/2012  . Arthritis 11/25/2011  . BACK PAIN, LUMBAR, CHRONIC 02/18/2010  . CORONARY ARTERY DISEASE 08/23/2008  . Cystic acne 11/04/2012  . Ganglion cyst 11/04/2012   Right forearm and left wrist.  . GERD 08/23/2008  . HTN (hypertension) 09/21/2012  . HYPERLIPIDEMIA 08/23/2008  . HYPOTHYROIDISM 08/23/2008  . Kidney stone 08/12/2013  . OSTEOPENIA 08/23/2008   declines  further studies  . Vitamin D deficiency 04/28/2015   Past Surgical History:  Procedure Laterality Date  . [place]  . CHOLECYSTECTOMY    . CORONARY STENT PLACEMENT  08/2010   Family History  Problem Relation Age of Onset  . Dementia Mother   . Arthritis Mother     rheumatoid  . Heart disease Mother     chf  . Osteoporosis Mother     broken hip  . Heart disease Father     massive MI  . Heart attack Father   . Hyperlipidemia Sister   . Hyperlipidemia Brother   . Heart disease Daughter     2 MIs, Pulmonic Valve disease  . Hyperlipidemia Daughter   . Hyperlipidemia Son   . Hypertension Son   . Cancer Maternal Grandmother     gyn  . Heart disease Maternal Grandmother     CHF  . Alcohol abuse Paternal Grandfather   . Dementia Paternal Grandfather   . Brain cancer Brother     brain tumor  . Breast cancer Maternal Aunt     breast   Allergies as of 01/26/2017   No Known Allergies     Medication List       Accurate as of 01/26/17  1:52 PM. Always use your most recent med list.          acyclovir 400 MG tablet Commonly known  as:  ZOVIRAX Take 1 tablet by mouth two  times daily x 25 days.Then take 5 tablets daily x 5 days.   ALPRAZolam 0.5 MG tablet Commonly known as:  XANAX Take 0.5 tablets (0.25 mg total) by mouth 2 (two) times daily as needed for sleep or anxiety.   ampicillin 500 MG capsule Commonly known as:  PRINCIPEN   aspirin 81 MG tablet Take 81 mg by mouth daily.   atenolol 50 MG tablet Commonly known as:  TENORMIN Take 1 tablet (50 mg total) by mouth daily.   atorvastatin 80 MG tablet Commonly known as:  LIPITOR Take 80 mg by mouth.   ezetimibe 10 MG tablet Commonly known as:  ZETIA Take 10 mg by mouth.   fish oil-omega-3 fatty acids 1000 MG capsule Take 2 g by mouth daily.   HYDROcodone-acetaminophen 10-325 MG tablet Commonly known as:  NORCO Take 1 tablet by mouth every 8 (eight) hours as needed.   lisinopril 10 MG  tablet Commonly known as:  PRINIVIL,ZESTRIL Take 1 tablet (10 mg total) by mouth daily.   nitroGLYCERIN 0.4 MG SL tablet Commonly known as:  NITROSTAT Place 1 tablet (0.4 mg total) under the tongue every 5 (five) minutes as needed for chest pain.   prednisoLONE acetate 1 % ophthalmic suspension Commonly known as:  PRED FORTE Place 1 drop into the right eye daily as needed.   verapamil 40 MG tablet Commonly known as:  CALAN Take 1 tablet (40 mg total) by mouth daily.       No results found for this or any previous visit (from the past 24 hour(s)). No results found.   ROS: Negative, with the exception of above mentioned in HPI   Objective:  BP (!) 143/90 (BP Location: Right Arm, Patient Position: Sitting, Cuff Size: Large)   Pulse 73   Temp 97.9 F (36.6 C)   Resp 20   Wt 192 lb 12.8 oz (87.5 kg)   LMP 11/23/1993   SpO2 97%   BMI 34.15 kg/m  Body mass index is 34.15 kg/m. Gen: Afebrile. No acute distress. Nontoxic in appearance, well developed, well nourished.  HENT: AT. [place].  MMM Eyes:Pupils Equal Round Reactive to light, Extraocular movements intact,  Conjunctiva without redness, discharge or icterus. MSK: no erythema or soft tissue swelling. No spine tenderness to palpation. NROM of lumbar,  thoracic spine and Bilateral arms. Mild discomfort with left rotation. Bilateral multiple DIP joints of hands with nodules and deformities. NV intact distally. Neuro: Normal gait. PERLA. EOMi. Alert. Oriented x3 Cranial nerves II through XII intact. Muscle strength 5/5 bilateral  extremity. DTRs equal bilaterally.   Assessment/Plan: DEMIA VIERA is a 76 y.o. female present for OV for  Acute left-sided thoracic back pain/multiple areas of arthralgia - discussed the different approaches to arthritis tx depending on type of arthritis.  - Narcotics are not ideal and do not wish to continue in this pt (provided by prior provider). FHX rheumatoid in her mother.  - exam more  consistent with OA, but will rule out.  - meloxicam (MOBIC) 15 MG tablet; Take 1 tablet (15 mg total) by mouth daily.  Dispense: 30 tablet; Refill: 5--> caution on edema, if fluid retained will need a different med.  - DG Thoracic Spine 4V; Future - C-reactive protein - Sed Rate (ESR) - BASIC METABOLIC PANEL WITH GFR - Rheumatoid Factor - Cyclic citrul peptide antibody, IgG - F/U dependent on results.   Reviewed expectations re: course of current medical issues.  Discussed self-management of symptoms.  Outlined signs and symptoms indicating need for more acute intervention.  Patient verbalized understanding and all questions were answered.  Patient received an After-Visit Summary.   electronically signed by:  Howard Pouch, DO  [place]

## 2017-01-27 ENCOUNTER — Other Ambulatory Visit: Payer: Self-pay | Admitting: Family Medicine

## 2017-01-27 ENCOUNTER — Telehealth: Payer: Self-pay | Admitting: Family Medicine

## 2017-01-27 ENCOUNTER — Ambulatory Visit (INDEPENDENT_AMBULATORY_CARE_PROVIDER_SITE_OTHER)
Admission: RE | Admit: 2017-01-27 | Discharge: 2017-01-27 | Disposition: A | Discharge status: Home / Self Care | Payer: Medicare Other | Source: Ambulatory Visit | Attending: Family Medicine | Admitting: Family Medicine

## 2017-01-27 DIAGNOSIS — M546 Pain in thoracic spine: Secondary | ICD-10-CM

## 2017-01-27 DIAGNOSIS — M5134 Other intervertebral disc degeneration, thoracic region: Secondary | ICD-10-CM | POA: Diagnosis not present

## 2017-01-27 LAB — CYCLIC CITRUL PEPTIDE ANTIBODY, IGG: Cyclic Citrullin Peptide Ab: <16 Units

## 2017-01-27 LAB — RHEUMATOID FACTOR: Rhuematoid fact SerPl-aCnc: <14 IU/mL (ref <14)

## 2017-01-27 LAB — SEDIMENTATION RATE: Sed Rate: 74 mm/hr — ABNORMAL HIGH (ref 0–30)

## 2017-01-27 LAB — C-REACTIVE PROTEIN: CRP: 2.5 mg/L (ref <8.0)

## 2017-01-27 NOTE — Telephone Encounter (Signed)
Patient is calling back to get lab results from her visit to Dr. Raoul Pitch yesterday.  Thank you,  -LL

## 2017-01-28 ENCOUNTER — Telehealth: Payer: Self-pay | Admitting: Family Medicine

## 2017-01-28 NOTE — Telephone Encounter (Signed)
Lab results have not been reviewed by physician yet. We will contact patient once provider sends a result note.

## 2017-01-28 NOTE — Telephone Encounter (Signed)
Spoke with patient reviewed lab results and instructions. Patient verbalized understanding. 

## 2017-01-28 NOTE — Telephone Encounter (Signed)
Lab results reviewed

## 2017-01-28 NOTE — Telephone Encounter (Signed)
Please call pt: - her labs are mostly all normal. She did have one inflammatory marker that was higher than normal, but that particular marker is not specific enough on its own to diagnose arthritis. Her rheumatoid labs are all normal.  - I have prescribed a daily medicine for her to take to help suppress the arthritic- like pain she is describing. I called it in a few days ago (mobic). I would like to see her back in 4 weeks after starting this medicine to evaluate response to medicine and for repeat labs. - If she is worsening over that time, I would want to see her sooner, - Her xray did show arthritic changes in her mid-back that may be causing her discomfort.

## 2017-02-15 DIAGNOSIS — R51 Headache: Secondary | ICD-10-CM | POA: Diagnosis not present

## 2017-02-15 DIAGNOSIS — E785 Hyperlipidemia, unspecified: Secondary | ICD-10-CM | POA: Diagnosis not present

## 2017-02-15 DIAGNOSIS — Z955 Presence of coronary angioplasty implant and graft: Secondary | ICD-10-CM | POA: Diagnosis not present

## 2017-02-15 DIAGNOSIS — I10 Essential (primary) hypertension: Secondary | ICD-10-CM | POA: Diagnosis not present

## 2017-02-15 DIAGNOSIS — I2510 Atherosclerotic heart disease of native coronary artery without angina pectoris: Secondary | ICD-10-CM | POA: Diagnosis not present

## 2017-02-15 DIAGNOSIS — I471 Supraventricular tachycardia: Secondary | ICD-10-CM | POA: Diagnosis not present

## 2017-02-16 ENCOUNTER — Telehealth: Payer: Self-pay | Admitting: Family Medicine

## 2017-02-16 NOTE — Telephone Encounter (Signed)
Faxed all requested items to number listed.

## 2017-02-16 NOTE — Telephone Encounter (Signed)
Caller name: Hoyle Sauer Relationship to patient: [facility] Can be reached: [phone] Pharmacy:  Reason for call: [facility] in [facility] needs office note stating that patient is on Injectable Cholesterol. States they need Lipid Panels back to 2015 and most recent office notes. States they received the necessary notes from Dr. Lucita Lora office but that office would not send them the notes from Dr. Charlett Blake. Please fax to [phone]

## 2017-02-22 DIAGNOSIS — E785 Hyperlipidemia, unspecified: Secondary | ICD-10-CM | POA: Diagnosis not present

## 2017-02-25 ENCOUNTER — Encounter: Payer: Self-pay | Admitting: Family Medicine

## 2017-02-25 ENCOUNTER — Ambulatory Visit (INDEPENDENT_AMBULATORY_CARE_PROVIDER_SITE_OTHER): Payer: Medicare Other | Admitting: Family Medicine

## 2017-02-25 VITALS — BP 128/79 | HR 62 | Temp 98.1°F | Resp 20 | Wt 191.2 lb

## 2017-02-25 DIAGNOSIS — H5712 Ocular pain, left eye: Secondary | ICD-10-CM | POA: Diagnosis not present

## 2017-02-25 DIAGNOSIS — M2669 Other specified disorders of temporomandibular joint: Secondary | ICD-10-CM | POA: Diagnosis not present

## 2017-02-25 DIAGNOSIS — R700 Elevated erythrocyte sedimentation rate: Secondary | ICD-10-CM | POA: Diagnosis not present

## 2017-02-25 DIAGNOSIS — R51 Headache: Secondary | ICD-10-CM

## 2017-02-25 DIAGNOSIS — R519 Headache, unspecified: Secondary | ICD-10-CM

## 2017-02-25 MED ORDER — OMEPRAZOLE 40 MG PO CPDR: 40.0000 mg | DELAYED_RELEASE_CAPSULE | Freq: Every day | ORAL | 3 refills | Status: AC

## 2017-02-25 MED ORDER — PREDNISONE 20 MG PO TABS: 60.0000 mg | ORAL_TABLET | Freq: Every day | ORAL | 0 refills | Status: AC

## 2017-02-25 MED ORDER — METHYLPREDNISOLONE ACETATE 80 MG/ML IJ SUSP
80.0000 mg | Freq: Once | INTRAMUSCULAR | Status: AC
  Administered 2017-02-25: 80 mg via INTRAMUSCULAR

## 2017-02-25 NOTE — Progress Notes (Signed)
[name] , August 11, 1941, 76 y.o., female MRN: [id number] Patient Care Team    Relationship Specialty Notifications Start End  Ma Hillock, DO PCP - General Family Medicine  11/06/16   Augusto Garbe, MD Referring Physician Cardiology  11/06/16   Rosana Berger, MD Referring Physician Gastroenterology  11/06/16   Vernie Ammons, MD Referring Physician Dermatology  11/06/16   [facility]  Optometry  11/06/16   Paralee Cancel, MD Consulting Physician Orthopedic Surgery  11/06/16   Ernst Spell, MD Referring Physician Urology  11/06/16     CC: headache Subjective: Pt presents for an  OV with complaints of Left-sided temporal headache of 1 month duration.  Patient states she was out gardening and portal weed and the daughter hit above her forehead just prior to onset of headache, and originally she thought that was the cause. However given the month's duration of headache she decided to come in today. She states the pain is located over the left side of her temporal area and she is having left high pressure. She denies eye pain, visual changes, light flashes, light sensitivity, nausea or vomiting.  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 She does admit to the original symptoms from her HSV infection in her right eye, are very similar to her symptoms that she is presently experiencing in her left eye. She is not followed routinely by ophthalmologist, her optometrist is in [place].  She had been experiencing jaw claudication over the last few weeks and called her cardiologist, cardiac cause was ruled out. She does have a history of coronary artery disease with angioplasty/stent placement and hypertension, she is compliant with daily baby aspirin, high-dose statin, lisinopril, atenolol and verapamil. She was seen approximately 4 weeks ago in this office with  increasing arthritic complaints of her mid back, left shoulder and hands. At that time she did not complain of any headaches or eye complaints. A rheumatological workup was initiated with a sedimentation rate of 74 (high). CMP, CRP, rheumatoid factor, CCP were normal. Thoracic spine x-ray resulted with evidence of lumbar disc degeneration. She states the headache was bothering her so bad she took one of her old hydrocodone pills, which didn't seem to be very helpful. She took a Brewing technologist powder "and this helped more.  Prior labs and imaging: Dg Thoracic Spine W/swimmers Result Date: 01/27/2017 CLINICAL DATA:  Mid back pain for the past month with no known injury. History of coronary artery disease, hyperlipidemia, former smoker. EXAM: THORACIC SPINE - 3 VIEWS COMPARISON:  Chest x-ray of August 19, 2004 FINDINGS: The thoracic vertebral bodies are preserved in height. The disc space heights are well maintained. There is mild disc space narrowing in the upper lumbar spine however. The pedicles and transverse processes are intact. There are no abnormal paravertebral soft tissue densities. IMPRESSION: There is no acute or significant chronic bony abnormality of the thoracic spine. 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 Electronically Signed   By: David  Martinique M.D.   On: 01/27/2017 14:45   Recent Results (from the past 2160 hour(s))  C-reactive protein     Status: None   Collection Time: 01/26/17  2:16 PM  Result Value Ref Range   CRP 2.5 <8.0 mg/L  Sed Rate (ESR)     Status: Abnormal   Collection  Time: 01/26/17  2:16 PM  Result Value Ref Range   Sed Rate 74 (H) 0 - 30 mm/hr  BASIC METABOLIC PANEL WITH GFR     Status: Abnormal   Collection Time: 01/26/17  2:16 PM  Result Value Ref Range   Sodium 138 135 - 146 mmol/L   Potassium 4.2 3.5 - 5.3 mmol/L   Chloride 103 98 - 110 mmol/L   CO2 26 20 - 31 mmol/L   Glucose, Bld 81 65 - 99 mg/dL   BUN 14 7 - 25  mg/dL   Creat 0.57 (L) 0.60 - 0.93 mg/dL    Comment:   For patients > or = 74 years of age: The upper reference limit for Creatinine is approximately 13% higher for people identified as African-American.      Calcium 9.7 8.6 - 10.4 mg/dL   GFR, Est African American >89 >=60 mL/min   GFR, Est Non African American >89 >=60 mL/min  Rheumatoid Factor     Status: None   Collection Time: 01/26/17  2:16 PM  Result Value Ref Range   Rhuematoid fact SerPl-aCnc <46 <80 IU/mL  Cyclic citrul peptide antibody, IgG     Status: None   Collection Time: 01/26/17  2:16 PM  Result Value Ref Range   Cyclic Citrullin Peptide Ab <16 Units    Comment:   Reference Range Negative               < 20 Weak Positive            20 - 39 Moderate Positive        40 - 59 Strong Positive        > 59     Depression screen [facility] 2/9 11/06/2016 05/05/2016 10/25/2013  Decreased Interest 0 0 0  Down, Depressed, Hopeless 0 0 0  PHQ - 2 Score 0 0 0    Allergies  Allergen Reactions  . Ezetimibe     Could not tolerate myalgias   Social History  Substance Use Topics  . Smoking status: Former Smoker    Quit date: 03/02/2009  . Smokeless tobacco: Never Used  . Alcohol use Yes     Comment: very rarely   Past Medical History:  Diagnosis Date  . ANEMIA 08/23/2008  . Anxiety 09/21/2012  . Arthritis 11/25/2011  . BACK PAIN, LUMBAR, CHRONIC 02/18/2010  . CORONARY ARTERY DISEASE 08/23/2008  . Cystic acne 11/04/2012  . Ganglion cyst 11/04/2012   Right forearm and left wrist.  . GERD 08/23/2008  . HTN (hypertension) 09/21/2012  . HYPERLIPIDEMIA 08/23/2008  . HYPOTHYROIDISM 08/23/2008  . Kidney stone 08/12/2013  . OSTEOPENIA 08/23/2008   declines further studies  . Vitamin D deficiency 04/28/2015   Past Surgical History:  Procedure Laterality Date  . [place]  . CHOLECYSTECTOMY    . CORONARY STENT PLACEMENT  08/2010   Family History  Problem Relation Age of Onset  . Dementia Mother   . Arthritis  Mother     rheumatoid  . Heart disease Mother     chf  . Osteoporosis Mother     broken hip  . Heart disease Father     massive MI  . Heart attack Father   . Hyperlipidemia Sister   . Hyperlipidemia Brother   . Heart disease Daughter     2 MIs, Pulmonic Valve disease  . Hyperlipidemia Daughter   . Hyperlipidemia Son   . Hypertension Son   . Cancer Maternal Grandmother  gyn  . Heart disease Maternal Grandmother     CHF  . Alcohol abuse Paternal Grandfather   . Dementia Paternal Grandfather   . Brain cancer Brother     brain tumor  . Breast cancer Maternal Aunt     breast   Allergies as of 02/25/2017      Reactions   Ezetimibe    Could not tolerate myalgias      Medication List       Accurate as of 02/25/17  9:55 AM. Always use your most recent med list.          acyclovir 400 MG tablet Commonly known as:  ZOVIRAX Take 1 tablet by mouth two  times daily x 25 days.Then take 5 tablets daily x 5 days.   ALPRAZolam 0.5 MG tablet Commonly known as:  XANAX Take 0.5 tablets (0.25 mg total) by mouth 2 (two) times daily as needed for sleep or anxiety.   ampicillin 500 MG capsule Commonly known as:  PRINCIPEN   aspirin 81 MG tablet Take 81 mg by mouth daily.   atenolol 50 MG tablet Commonly known as:  TENORMIN Take 1 tablet (50 mg total) by mouth daily.   atorvastatin 80 MG tablet Commonly known as:  LIPITOR Take 80 mg by mouth.   fish oil-omega-3 fatty acids 1000 MG capsule Take 2 g by mouth daily.   HYDROcodone-acetaminophen 10-325 MG tablet Commonly known as:  NORCO Take 1 tablet by mouth every 8 (eight) hours as needed.   lisinopril 10 MG tablet Commonly known as:  PRINIVIL,ZESTRIL Take 1 tablet (10 mg total) by mouth daily.   meloxicam 15 MG tablet Commonly known as:  MOBIC Take 1 tablet (15 mg total) by mouth daily.   nitroGLYCERIN 0.4 MG SL tablet Commonly known as:  NITROSTAT Place 1 tablet (0.4 mg total) under the tongue every 5 (five)  minutes as needed for chest pain.   prednisoLONE acetate 1 % ophthalmic suspension Commonly known as:  PRED FORTE Place 1 drop into the right eye daily as needed.   verapamil 40 MG tablet Commonly known as:  CALAN Take 1 tablet (40 mg total) by mouth daily.       No results found for this or any previous visit (from the past 24 hour(s)). No results found.   ROS: Negative, with the exception of above mentioned in HPI   Objective:  BP 128/79 (BP Location: Right Arm, Patient Position: Sitting, Cuff Size: Large)   Pulse 62   Temp 98.1 F (36.7 C)   Resp 20   Wt 191 lb 4 oz (86.8 kg)   LMP 11/23/1993   SpO2 98%   BMI 33.88 kg/m  Body mass index is 33.88 kg/m. Gen: Afebrile. No acute distress. Nontoxic in appearance, well developed, well nourished. Very pleasant Caucasian female. HENT: AT. .  Left temporal area tender to palpation. MMM, no oral lesions. No tenderness to palpation jaw, or range of motion of mandible without notable clicking. Eyes: Left Pupil Equal Round Reactive to light, Extraocular movements intact,  Conjunctiva without redness, discharge or icterus. Orbit, eyelids and sclera are normal. Vision is grossly intact. No pain with external global pressure. Neck/lymp/endocrine: Supple, no lymphadenopathy Skin: No rashes, purpura or petechiae.  Neuro: Normal gait. PERLA. EOMi. Alert. Oriented x3   Assessment/Plan: [name] is a 76 y.o. female present for OV for Left eye pain Elevated sed rate Temporal pain Jaw claudication - Lengthy discussion with patient today given the elective signs  and symptoms she's experienced over the last month starting with arthritic pain, jaw claudication, and now left temporal pain and headaches with elevated ESR and reproducible temporal tenderness to have great concern for temporal arteritis. Patient was given IM injection today of steroid, and started on 60 mg prednisone starting tomorrow. Prescribed PPI for her to take while  being prescribed steroid.  - Discussed with patient needs she needs an emergent referral to ophthalmology to have an adequate eye exam to rule out any signs of ischemia or damage behind her eye. Discussed with her I'm uncertain at this time if she has temporal arteritis or  other cause of her current condition including HSV infection. Dr. Rachael Fee office, ophthalmology,  was contacted for emergent referral and is agreeable to see her today at 3 PM. - Urgent rheumatology referral was also placed today for temporal arteritis workup/elevated sedimentation rate/arthritic complaints. - Ambulatory urgent referral to Rheumatology - Emergent referral to ophthalmology - methylPREDNISolone acetate (DEPO-MEDROL) injection 80 mg; Inject 1 mL (80 mg total) into the muscle once. - Prednisone 60 mg daily, starting tomorrow, continue until sees rheumatology hopefully within 1 week. PPI coverage while on high-dose prednisone. Stop other NSAIDs. If referral to rheumatology referral takes longer one week and wants to see the patient back in a week.  Reviewed expectations re: course of current medical issues.  Discussed self-management of symptoms.  Outlined signs and symptoms indicating need for more acute intervention.  Patient verbalized understanding and all questions were answered.  Patient received an After-Visit Summary.   electronically signed by:  Howard Pouch, DO  [place]

## 2017-02-25 NOTE — Patient Instructions (Signed)
Steroid injection today, start oral steroid tomorrow as directed until you see rheumatology and they will take over medication management.  Do not take the mobic  while on steroid.  Start omeprazole also today to protect your stomach on high dose steroid for a now.  Please arrive 15 minutes early for your appt with Dr. Bing Plume at 3:00 today at the [facility] office.   They will call you to schedule with rheumatology.   Any increase in pain, visual loss please go to ED.    Temporal Arteritis Temporal arteritis, also called giant cell arteritis, is a condition that causes arteries to become swollen (inflamed). It usually affects arteries in your head and face, but arteries in any part of the body can become inflamed. Temporal arteritis can cause serious problems, such as bone loss, diabetes, and blindness. What are the causes? The cause is unknown. What increases the risk?  Being older than 68.  Being a woman.  Being Caucasian.  Being of [place], [place], [place], [place] or [place] ancestry.  Having polymyalgia rheumatica (PMR). What are the signs or symptoms? Some people with temporal arteritis have just one symptom, while other have several symptoms. Most signs and symptoms are related to the head and face. Signs and symptoms may include:  Hard or swollen temples (common). Your temples are the flattened area on either side of your forehead. If your temples are swollen, it may hurt to touch them.  Pain when combing your hair or when laying your head down.  Pain in the jaw when chewing.  Pain in the throat or tongue.  Problems with your vision, such as sudden loss of vision in one eye, or seeing double.  Fever.  Fatigue.  A dry cough.  Pain in the hips and shoulders.  Pain in the arms during exercise.  Depression.  Weight loss. How is this diagnosed? Your health care provider will ask about your symptoms and do a physical exam. He or she may also perform an eye  exam and tests, such as:  A complete blood count.  An erythrocyte sedimentation rate test, also called the sed rate test.  A C-reactive protein (CRP) test.  A tissue sample (biopsy) test. How is this treated? Temporal arteritis is treated with a type of medicine called a corticosteroid. Vision problems may be treated with additional medicines. You will need to see your health care provider while you are being treated. During follow-up visits, your health care provider will check for problems by:  Performing blood tests and bone density tests.  Checking your blood pressure and blood sugar. Follow these instructions at home:  Take medicines only as directed by your health care provider.  Take any vitamins or supplements that your health care provider suggests. These may include vitamin D and calcium, which help keep your bones from becoming weak.  Exercise. Talk with your health care provider about what exercises are okay for you to do. Usually exercises that increase your heart rate (aerobic exercise), such as walking, are recommended. Aerobic exercise helps control your blood pressure and prevent bone loss.  Follow a healthy diet. Include healthy sources of protein, fruits, vegetables, and whole grains in your diet. Following a healthy diet helps prevent bone damage and diabetes. Contact a health care provider if:  Your symptoms get worse.  Your fever, fatigue, headache, weight loss, or pain in your jaw gets worse.  You develop signs of infection, such as fever, swelling, redness, warmth, and tenderness. Get help right away if:  Your vision gets worse.  Your pain does not go away, even after you take pain medicine.  You have chest pain.  You have trouble breathing.  One side of your face or body suddenly becomes weak or numb. This information is not intended to replace advice given to you by your health care provider. Make sure you discuss any questions you have with your  health care provider. Document Released: 09/06/2009 Document Revised: 07/09/2016 Document Reviewed: 01/03/2014 Elsevier Interactive Patient Education  2017 [employer].

## 2017-03-02 ENCOUNTER — Telehealth: Payer: Self-pay | Admitting: Family Medicine

## 2017-03-02 NOTE — Telephone Encounter (Signed)
When is her appt with rheumatology (I placed it urgently), I wanted to see her within a week if not seen by rheumatology before, which would be by tomorrow? If her pain is still that bad after starting steroid, we need to consider MRI and controlled substance for pain control. She has Norco on her list from prior provider, she can take that for pain control for now if needed, but we need to move forward on finding cause of pain. If rheumatology not schedule or by the end of the week, then I want to see her and get image ASAP

## 2017-03-02 NOTE — Telephone Encounter (Signed)
Patient calling to report she was seen on 02/25/17 for temporal pain.  She was prescribed prednisone.  However, she states the medication is not helping at all.    She is still experiencing severe headaches and wants to know if there is something else she can take to help ease the pain.  The best number to reach her at is [phone].  However, she has to take her dog to the groomer, if you call that number and get no answer, please call mobile phone at [phone].

## 2017-03-02 NOTE — Telephone Encounter (Signed)
Spoke with patient her appt with rheumatology is 03/08/17. Patient unable to come in today. I have scheduled her to be seen here tomorrow. Patient states she is using her Norco but it is not working well. Advised patient if pain gets worse or dshe develops any other symptoms report to the ER. Patient verbalized understanding.

## 2017-03-03 ENCOUNTER — Ambulatory Visit (INDEPENDENT_AMBULATORY_CARE_PROVIDER_SITE_OTHER): Payer: Medicare Other | Admitting: Family Medicine

## 2017-03-03 ENCOUNTER — Encounter: Payer: Self-pay | Admitting: Family Medicine

## 2017-03-03 VITALS — BP 130/82 | HR 54 | Resp 20 | Wt 187.5 lb

## 2017-03-03 DIAGNOSIS — I739 Peripheral vascular disease, unspecified: Secondary | ICD-10-CM | POA: Diagnosis not present

## 2017-03-03 DIAGNOSIS — M2669 Other specified disorders of temporomandibular joint: Secondary | ICD-10-CM

## 2017-03-03 DIAGNOSIS — H5712 Ocular pain, left eye: Secondary | ICD-10-CM | POA: Diagnosis not present

## 2017-03-03 DIAGNOSIS — R519 Headache, unspecified: Secondary | ICD-10-CM | POA: Insufficient documentation

## 2017-03-03 DIAGNOSIS — R700 Elevated erythrocyte sedimentation rate: Secondary | ICD-10-CM

## 2017-03-03 DIAGNOSIS — R51 Headache: Secondary | ICD-10-CM | POA: Diagnosis not present

## 2017-03-03 DIAGNOSIS — B0050 Herpesviral ocular disease, unspecified: Secondary | ICD-10-CM

## 2017-03-03 NOTE — Progress Notes (Signed)
[name] , [birth year], 76 y.o., female MRN: [id number] Patient Care Team    Relationship Specialty Notifications Start End  Ma Hillock, DO PCP - General Family Medicine  11/06/16   Augusto Garbe, MD Referring Physician Cardiology  11/06/16   Rosana Berger, MD Referring Physician Gastroenterology  11/06/16   Vernie Ammons, MD Referring Physician Dermatology  11/06/16   [facility]  Optometry  11/06/16   Paralee Cancel, MD Consulting Physician Orthopedic Surgery  11/06/16   Ernst Spell, MD Referring Physician Urology  11/06/16     CC: headache Subjective:  She was seen acutely last week after 4 weeks of left-sided orbit, temporal and jaw claudication. Patient had elevated ESR of 74 3 weeks prior. Patient was urgently worked into Dr. Bing Plume, ophthalmologist same day with normal evaluation, no ischemia. 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 She is scheduled with rheumatology next week to further work up potential of temporal arteritis. She was started on 60 mg of prednisone daily, and is tolerating the medication, however she is still having left temporal pain. She has been taking Vicodin 10-325 she was provided by another provider for her back pain, and this is not resolving her eye pain. Patient is still complaining of left temporal pain, left orbit pain, and left jaw claudication. Patient called in the office yesterday requesting something for pain.   Prior note 02/25/2017: Pt presents for an  OV with complaints of Left-sided temporal headache of 1 month duration.  Patient states she was out gardening and portal weed and the daughter hit above her forehead just prior to onset of headache, and originally she thought that was the cause. However given the month's duration of headache she decided to come in today. She states the pain is located over the left side of her temporal area and  she is having left high pressure. She denies eye pain, visual changes, light flashes, light sensitivity, nausea or vomiting.  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 She does admit to the original symptoms from her HSV infection in her right eye, are very similar to her symptoms that she is presently experiencing in her left eye. She is not followed routinely by ophthalmologist, her optometrist is in [place].  She had been experiencing jaw claudication over the last few weeks and called her cardiologist, cardiac cause was ruled out. She does have a history of coronary artery disease with angioplasty/stent placement and hypertension, she is compliant with daily baby aspirin, high-dose statin, lisinopril, atenolol and verapamil. She was seen approximately 4 weeks ago in this office with increasing arthritic complaints of her mid back, left shoulder and hands. At that time she did not complain of any headaches or eye complaints. A rheumatological workup was initiated with a sedimentation rate of 74 (high). CMP, CRP, rheumatoid factor, CCP were normal. Thoracic spine x-ray resulted with evidence of lumbar disc degeneration. She states the headache was bothering her so bad she took one of her old hydrocodone pills, which didn't seem to be very helpful. She took a Brewing technologist powder "and this helped more.    Recent Results (from the past 2160 hour(s))  C-reactive protein     Status: None   Collection Time: 01/26/17  2:16 PM  Result Value Ref Range  CRP 2.5 <8.0 mg/L  Sed Rate (ESR)     Status: Abnormal   Collection Time: 01/26/17  2:16 PM  Result Value Ref Range   Sed Rate 74 (H) 0 - 30 mm/hr  BASIC METABOLIC PANEL WITH GFR     Status: Abnormal   Collection Time: 01/26/17  2:16 PM  Result Value Ref Range   Sodium 138 135 - 146 mmol/L   Potassium 4.2 3.5 - 5.3 mmol/L   Chloride 103 98 - 110 mmol/L     CO2 26 20 - 31 mmol/L   Glucose, Bld 81 65 - 99 mg/dL   BUN 14 7 - 25 mg/dL   Creat 0.57 (L) 0.60 - 0.93 mg/dL    Comment:   For patients > or = 74 years of age: The upper reference limit for Creatinine is approximately 13% higher for people identified as African-American.      Calcium 9.7 8.6 - 10.4 mg/dL   GFR, Est African American >89 >=60 mL/min   GFR, Est Non African American >89 >=60 mL/min  Rheumatoid Factor     Status: None   Collection Time: 01/26/17  2:16 PM  Result Value Ref Range   Rhuematoid fact SerPl-aCnc <67 <12 IU/mL  Cyclic citrul peptide antibody, IgG     Status: None   Collection Time: 01/26/17  2:16 PM  Result Value Ref Range   Cyclic Citrullin Peptide Ab <16 Units    Comment:   Reference Range Negative               < 20 Weak Positive            20 - 39 Moderate Positive        40 - 59 Strong Positive        > 59     Depression screen [facility] 2/9 11/06/2016 05/05/2016 10/25/2013  Decreased Interest 0 0 0  Down, Depressed, Hopeless 0 0 0  PHQ - 2 Score 0 0 0    Allergies  Allergen Reactions  . Ezetimibe     Could not tolerate myalgias   Social History  Substance Use Topics  . Smoking status: Former Smoker    Quit date: 03/02/2009  . Smokeless tobacco: Never Used  . Alcohol use Yes     Comment: very rarely   Past Medical History:  Diagnosis Date  . ANEMIA 08/23/2008  . Anxiety 09/21/2012  . Arthritis 11/25/2011  . BACK PAIN, LUMBAR, CHRONIC 02/18/2010  . CORONARY ARTERY DISEASE 08/23/2008  . Cystic acne 11/04/2012  . Ganglion cyst 11/04/2012   Right forearm and left wrist.  . GERD 08/23/2008  . 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 HTN (hypertension) 09/21/2012  . HYPERLIPIDEMIA 08/23/2008  . HYPOTHYROIDISM 08/23/2008  . Kidney stone 08/12/2013  . OSTEOPENIA 08/23/2008   declines further studies  . Vitamin D deficiency 04/28/2015   Past Surgical History:  Procedure Laterality Date  .  [place]  . CHOLECYSTECTOMY    . CORONARY STENT PLACEMENT  08/2010   MRI conditional; ok 1.5-3 tesla   Family History  Problem Relation Age of Onset  . Dementia Mother   . Arthritis Mother     rheumatoid  . Heart disease Mother     chf  . Osteoporosis Mother     broken hip  . Heart disease Father     massive MI  . Heart attack Father   . Hyperlipidemia  Sister   . Hyperlipidemia Brother   . Heart disease Daughter     2 MIs, Pulmonic Valve disease  . Hyperlipidemia Daughter   . Hyperlipidemia Son   . Hypertension Son   . Cancer Maternal Grandmother     gyn  . Heart disease Maternal Grandmother     CHF  . Alcohol abuse Paternal Grandfather   . Dementia Paternal Grandfather   . Brain cancer Brother     brain tumor  . Breast cancer Maternal Aunt     breast   Allergies as of 03/03/2017      Reactions   Ezetimibe    Could not tolerate myalgias      Medication List       Accurate as of 03/03/17 12:36 PM. Always use your most recent med list.          acyclovir 400 MG tablet Commonly known as:  ZOVIRAX Take 1 tablet by mouth two  times daily x 25 days.Then take 5 tablets daily x 5 days.   ALPRAZolam 0.5 MG tablet Commonly known as:  XANAX Take 0.5 tablets (0.25 mg total) by mouth 2 (two) times daily as needed for sleep or anxiety.   ampicillin 500 MG capsule Commonly known as:  PRINCIPEN   aspirin 81 MG tablet Take 81 mg by mouth daily.   atenolol 50 MG tablet Commonly known as:  TENORMIN Take 1 tablet (50 mg total) by mouth daily.   atorvastatin 80 MG tablet Commonly known as:  LIPITOR Take 80 mg by mouth.   fish oil-omega-3 fatty acids 1000 MG capsule Take 2 g by mouth daily.   HYDROcodone-acetaminophen 10-325 MG tablet Commonly known as:  NORCO Take 1 tablet by mouth every 8 (eight) hours as needed.   lisinopril 10 MG tablet Commonly known as:  PRINIVIL,ZESTRIL Take 1 tablet (10 mg total) by mouth daily.   meloxicam 15 MG  tablet Commonly known as:  MOBIC Take 1 tablet (15 mg total) by mouth daily.   nitroGLYCERIN 0.4 MG SL tablet Commonly known as:  NITROSTAT Place 1 tablet (0.4 mg total) under the tongue every 5 (five) minutes as needed for chest pain.   omeprazole 40 MG capsule Commonly known as:  PRILOSEC Take 1 capsule (40 mg total) by mouth daily.   prednisoLONE acetate 1 % ophthalmic suspension Commonly known as:  PRED FORTE Place 1 drop into the right eye daily as needed.   predniSONE 20 MG tablet Commonly known as:  DELTASONE Take 3 tablets (60 mg total) by mouth daily with breakfast.   verapamil 40 MG tablet Commonly known as:  CALAN Take 1 tablet (40 mg total) by mouth daily.       No results found for this or any previous visit (from the past 24 hour(s)). No results found.   ROS: Negative, with the exception of above mentioned in HPI   Objective:  BP 130/82 (BP Location: Right Arm, Patient Position: Sitting, Cuff Size: Large)   Pulse (!) 54   Resp 20   Wt 187 lb 8 oz (85 kg)   LMP 11/23/1993   SpO2 98%   BMI 33.21 kg/m  Body mass index is 33.21 kg/m.  Gen: Afebrile. No acute distress. Nontoxic in appearance, well-developed, well-nourished, Caucasian female. HENT: AT. [place]. Bilateral TM visualized and normal in appearance. MMM. Tender to palpation over left orbit, left temple. New mild tenderness to palpation posterior left ear. Eyes:Pupils Equal Round Reactive to light, Extraocular movements intact,  Conjunctiva without  redness, discharge or icterus. Neck/lymp/endocrine: Supple, no lymphadenopathy CV: RRR 2/6 systolic murmur present, no edema, +2/4 P posterior tibialis pulses Skin: No rashes, purpura or petechiae.  Neuro:  Normal gait. PERLA. EOMi. Alert. Oriented x3  Assessment/Plan: SHANDA CADOTTE is a 76 y.o. female present for OV for Left eye pain Elevated sed rate Temporal pain Jaw claudication - Decrease steroids to 50 mg daily until followed by rheumatology next  week, then they can take over management or taper. - Continue PPI while on steroids.  - Urgent rheumatology referral was placed last week and she has an appt next week.  - Pt was emergently seen at ophthalmology without identified cause or signs of inflammation/damage/ischemia.  - Discussed pain with patient today, she has Vicodin from prior provider, would encourage her to schedule every 6-8 hours for the next 1-2 days, and then try to back narcotic medication. Discussed could increase therapy to Percocet, however that does make her more sedated. She chose to stay with the medication she is using schedule it. She did not need refills. - MRI face/trigeminal with and without contrast. I spoke with the radiologist and this is the test recommended. Discussed patient does have a coronary artery stent in the circumflex which is MR condition: Stent hepatic Neddick field of 1. 5-3 Tesla, spatial gradient field of 1000 gauss/cm or less, maximum whole-body average specific absorption rate of 2.0W/Kg or less, under normal operating mode only, for 15 minutes of scanning. This was also documented in the MR orders and clarified with radiology. - Follow-up if condition is worsening, emergently in ED, if after hours or prior to MRI.  Reviewed expectations re: course of current medical issues.  Discussed self-management of symptoms.  Outlined signs and symptoms indicating need for more acute intervention.  Patient verbalized understanding and all questions were answered.  Patient received an After-Visit Summary.   > 25 minutes spent with patient, >50% of time spent face to face counseling and/or coordinating care.    electronically signed by:  Howard Pouch, DO  [place]

## 2017-03-03 NOTE — Patient Instructions (Signed)
Continue stomach medicine while on steroids.  Cut back to 2.5 tabs of steroid pills until you see the Rheumatologist.  We will order an image study for you they will call to schedule.   You can take plain mucinex to dry up the secretions you reported today.   Make sure rheumatology gives you instructions on prednisone.

## 2017-03-06 ENCOUNTER — Ambulatory Visit (HOSPITAL_BASED_OUTPATIENT_CLINIC_OR_DEPARTMENT_OTHER)
Admission: RE | Admit: 2017-03-06 | Discharge: 2017-03-06 | Disposition: A | Discharge status: Home / Self Care | Payer: Medicare Other | Source: Ambulatory Visit | Attending: Family Medicine | Admitting: Family Medicine

## 2017-03-06 DIAGNOSIS — M2669 Other specified disorders of temporomandibular joint: Secondary | ICD-10-CM | POA: Insufficient documentation

## 2017-03-06 DIAGNOSIS — H5712 Ocular pain, left eye: Secondary | ICD-10-CM | POA: Insufficient documentation

## 2017-03-06 DIAGNOSIS — R519 Headache, unspecified: Secondary | ICD-10-CM

## 2017-03-06 DIAGNOSIS — R700 Elevated erythrocyte sedimentation rate: Secondary | ICD-10-CM | POA: Insufficient documentation

## 2017-03-06 DIAGNOSIS — R51 Headache: Secondary | ICD-10-CM | POA: Insufficient documentation

## 2017-03-06 MED ORDER — GADOBENATE DIMEGLUMINE 529 MG/ML IV SOLN: 17.0000 mL | Freq: Once | INTRAVENOUS | Status: DC | PRN

## 2017-03-08 ENCOUNTER — Telehealth: Payer: Self-pay | Admitting: Family Medicine

## 2017-03-08 DIAGNOSIS — R51 Headache: Secondary | ICD-10-CM | POA: Diagnosis not present

## 2017-03-08 NOTE — Telephone Encounter (Signed)
Spoke with patient reviewed results . Patient declined Neuro referral at this time. Patient states she saw Rheumatology today and they are going to do a biopsy.

## 2017-03-08 NOTE — Telephone Encounter (Signed)
Please call patient: - Her MRI was negative for any problems concerning the trigeminal nerve or the eye itself. I would recommend she continue to follow with rheumatology as scheduled to rule out temporal arteritis, which was her original concern. - she did have moderate atrophy, which occurs some with aging, but more so seen in people with signs of dementia. I only mention this finding because of her fhx of dementia. If she would like a neuro referral for further investigation/recommendations or she is having memory issues I would be happy to place a referral for her today.

## 2017-03-08 NOTE — Telephone Encounter (Signed)
Spoke with patient husband he will have patient call back.

## 2017-03-11 ENCOUNTER — Ambulatory Visit (INDEPENDENT_AMBULATORY_CARE_PROVIDER_SITE_OTHER): Payer: Medicare Other | Admitting: Otolaryngology

## 2017-03-11 DIAGNOSIS — R51 Headache: Secondary | ICD-10-CM | POA: Diagnosis not present

## 2017-03-12 ENCOUNTER — Telehealth: Payer: Self-pay | Admitting: *Deleted

## 2017-03-12 NOTE — Telephone Encounter (Signed)
Please call pts pharmacy and inquire on the use of her pred forte. Who has been prescribing the medication for her? When was last refill? We discuss this during her last visit as she stated. However, we discussed this is refilled by an ophthalmologist after exam, not a PCP.  I did see that her prior PCP refilled it in 2014.   Thanks.

## 2017-03-12 NOTE — Telephone Encounter (Signed)
Patient called and left message stating she needs a refill on her Pre Forte eye drops. She states she discussed  this at her last appt. Please advise

## 2017-03-12 NOTE — Telephone Encounter (Signed)
Pharmacy has not refilled this medication since before 2016. Spoke with patient explained information she states she has an upcoming appt with eye Dr she will discuss it with them.

## 2017-03-18 ENCOUNTER — Encounter: Payer: Self-pay | Admitting: Family Medicine

## 2017-03-18 ENCOUNTER — Ambulatory Visit (INDEPENDENT_AMBULATORY_CARE_PROVIDER_SITE_OTHER): Payer: Medicare Other | Admitting: Family Medicine

## 2017-03-18 VITALS — BP 137/71 | HR 53 | Temp 97.9°F | Resp 20 | Wt 185.8 lb

## 2017-03-18 DIAGNOSIS — J019 Acute sinusitis, unspecified: Secondary | ICD-10-CM | POA: Diagnosis not present

## 2017-03-18 DIAGNOSIS — M546 Pain in thoracic spine: Secondary | ICD-10-CM

## 2017-03-18 DIAGNOSIS — R51 Headache: Secondary | ICD-10-CM | POA: Diagnosis not present

## 2017-03-18 DIAGNOSIS — E782 Mixed hyperlipidemia: Secondary | ICD-10-CM

## 2017-03-18 DIAGNOSIS — R519 Headache, unspecified: Secondary | ICD-10-CM

## 2017-03-18 MED ORDER — MELOXICAM 15 MG PO TABS: 15.0000 mg | ORAL_TABLET | Freq: Every day | ORAL | 3 refills | Status: AC

## 2017-03-18 MED ORDER — LORATADINE 10 MG PO TABS: 10.0000 mg | ORAL_TABLET | Freq: Every day | ORAL | 11 refills | Status: AC

## 2017-03-18 MED ORDER — HYDROCODONE-ACETAMINOPHEN 10-325 MG PO TABS: 1.0000 | ORAL_TABLET | Freq: Four times a day (QID) | ORAL | 0 refills | Status: AC | PRN

## 2017-03-18 MED ORDER — AZITHROMYCIN 250 MG PO TABS: ORAL_TABLET | ORAL | 0 refills | Status: DC

## 2017-03-18 MED ORDER — ATORVASTATIN CALCIUM 80 MG PO TABS: 80.0000 mg | ORAL_TABLET | Freq: Every day | ORAL | 3 refills | Status: AC

## 2017-03-18 NOTE — Progress Notes (Signed)
[name] , 11/12/1941, 76 y.o., female MRN: [id number] Patient Care Team    Relationship Specialty Notifications Start End  Ma Hillock, DO PCP - General Family Medicine  11/06/16   Augusto Garbe, MD Referring Physician Cardiology  11/06/16   Rosana Berger, MD Referring Physician Gastroenterology  11/06/16   Vernie Ammons, MD Referring Physician Dermatology  11/06/16   [place]  Optometry  11/06/16   Paralee Cancel, MD Consulting Physician Orthopedic Surgery  11/06/16   Ernst Spell, MD Referring Physician Urology  11/06/16   Hennie Duos, MD Consulting Physician Rheumatology  03/18/17   Calvert Cantor, MD Consulting Physician Ophthalmology  03/18/17     Chief Complaint  Patient presents with  . URI    drainage and congestion     Subjective: Pt presents for an OV with complaints of head congestion of 3-4 weeks duration.  Associated symptoms include increased mucous production. She was a former smoker of many years, but does not have the diagnosis of COPD. She reports clearing of the mucous of she remains on mucinex. She does not like taking mucinex all the time. She is not on an allergy regimen. She denies fever, chills, nausea, vomit. 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 She is on steroids for this condition.  She is also asking for refills on the mobic for her back. She reports it helped a great deal and now her pain is back. She is taking advil and narcotic (old script to help with all the above pain), but she is out of meds.    Depression screen [facility] 2/9 11/06/2016 05/05/2016 10/25/2013  Decreased Interest 0 0 0  Down, Depressed, Hopeless 0 0 0  PHQ - 2 Score 0 0 0    Allergies  Allergen Reactions  . Ezetimibe     Could not tolerate myalgias   Social History  Substance Use Topics  . Smoking status: Former Smoker    Types: Cigarettes    Quit date: 03/02/2009  . Smokeless tobacco: Never Used  .  Alcohol use Yes     Comment: very rarely   Past Medical History:  Diagnosis Date  . ANEMIA 08/23/2008  . Anxiety 09/21/2012  . Arthritis 11/25/2011  . BACK PAIN, LUMBAR, CHRONIC 02/18/2010  . CORONARY ARTERY DISEASE 08/23/2008  . Cystic acne 11/04/2012  . Ganglion cyst 11/04/2012   Right forearm and left wrist.  . GERD 08/23/2008  . 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 HTN (hypertension) 09/21/2012  . HYPERLIPIDEMIA 08/23/2008  . HYPOTHYROIDISM 08/23/2008  . Kidney stone 08/12/2013  . OSTEOPENIA 08/23/2008   declines further studies  . Vitamin D deficiency 04/28/2015   Past Surgical History:  Procedure Laterality Date  . [place]  . CHOLECYSTECTOMY    . CORONARY STENT PLACEMENT  08/2010   MRI conditional; ok 1.5-3 tesla   Family History  Problem Relation Age of Onset  . Dementia Mother   . Arthritis Mother     rheumatoid  . Heart disease Mother     chf  . Osteoporosis Mother     broken hip  . Heart disease Father     massive MI  . Heart attack Father   . Hyperlipidemia Sister   . Hyperlipidemia Brother   . Heart disease Daughter     2 MIs, Pulmonic Valve disease  . Hyperlipidemia  Daughter   . Hyperlipidemia Son   . Hypertension Son   . Cancer Maternal Grandmother     gyn  . Heart disease Maternal Grandmother     CHF  . Alcohol abuse Paternal Grandfather   . Dementia Paternal Grandfather   . Brain cancer Brother     brain tumor  . Breast cancer Maternal Aunt     breast   Allergies as of 03/18/2017      Reactions   Ezetimibe    Could not tolerate myalgias      Medication List       Accurate as of 03/18/17 12:51 PM. Always use your most recent med list.          acyclovir 400 MG tablet Commonly known as:  ZOVIRAX Take 1 tablet by mouth two  times daily x 25 days.Then take 5 tablets daily x 5 days.   ALPRAZolam 0.5 MG tablet Commonly known as:  XANAX Take 0.5 tablets (0.25 mg total) by  mouth 2 (two) times daily as needed for sleep or anxiety.   aspirin 81 MG tablet Take 81 mg by mouth daily.   atenolol 50 MG tablet Commonly known as:  TENORMIN Take 1 tablet (50 mg total) by mouth daily.   atorvastatin 80 MG tablet Commonly known as:  LIPITOR Take 1 tablet (80 mg total) by mouth daily at 6 PM.   azithromycin 250 MG tablet Commonly known as:  ZITHROMAX 500 mg day 1, then 250 mg QD   fish oil-omega-3 fatty acids 1000 MG capsule Take 2 g by mouth daily.   lisinopril 10 MG tablet Commonly known as:  PRINIVIL,ZESTRIL Take 1 tablet (10 mg total) by mouth daily.   loratadine 10 MG tablet Commonly known as:  CLARITIN Take 1 tablet (10 mg total) by mouth daily.   LORCET 10/650 PO Take 1 tablet by mouth every 6 (six) hours as needed.   HYDROcodone-acetaminophen 10-325 MG tablet Commonly known as:  NORCO Take 1 tablet by mouth every 6 (six) hours as needed.   meloxicam 15 MG tablet Commonly known as:  MOBIC Take 1 tablet (15 mg total) by mouth daily.   nitroGLYCERIN 0.4 MG SL tablet Commonly known as:  NITROSTAT Place 1 tablet (0.4 mg total) under the tongue every 5 (five) minutes as needed for chest pain.   omeprazole 40 MG capsule Commonly known as:  PRILOSEC Take 1 capsule (40 mg total) by mouth daily.   prednisoLONE acetate 1 % ophthalmic suspension Commonly known as:  PRED FORTE Place 1 drop into the right eye daily as needed.   predniSONE 20 MG tablet Commonly known as:  DELTASONE Take 3 tablets (60 mg total) by mouth daily with breakfast.   verapamil 40 MG tablet Commonly known as:  CALAN Take 1 tablet (40 mg total) by mouth daily.       All past medical history, surgical history, allergies, family history, immunizations andmedications were updated in the EMR today and reviewed under the history and medication portions of their EMR.     ROS: Negative, with the exception of above mentioned in HPI   Objective:  BP 137/71 (BP Location:  Right Arm, Patient Position: Sitting, Cuff Size: Large)   Pulse (!) 53   Temp 97.9 F (36.6 C)   Resp 20   Wt 185 lb 12 oz (84.3 kg)   LMP 11/23/1993   SpO2 100%   BMI 32.90 kg/m  Body mass index is 32.9 kg/m. Gen: Afebrile. No acute  distress. Nontoxic in appearance, well developed, well nourished.  HENT: AT. [place]. Bilateral TM visualized WNL. MMM, no oral lesions. Bilateral nares with mild erythema and drainage. Throat without erythema or exudates. Mild cough, no hoarseness, no TTP sinus.  Eyes:Pupils Equal Round Reactive to light, Extraocular movements intact,  Conjunctiva without redness, discharge or icterus. Neck/lymp/endocrine: Supple,no lymphadenopathy CV: RRR, no edema.  Chest: CTAB, no wheeze or crackles. Good air movement, normal resp effort.  Neuro:  Normal gait. PERLA. EOMi. Alert. Oriented x3  Psych: Normal affect, dress and demeanor. Normal speech. Normal thought content and judgment.  No exam data present No results found. No results found for this or any previous visit (from the past 24 hour(s)).  Assessment/Plan: [name] is a 76 y.o. female present for OV for  Acute left-sided thoracic back pain - cautioned on NSAID use with prednisone use. Continue PPI.  - meloxicam (MOBIC) 15 MG tablet; Take 1 tablet (15 mg total) by mouth daily.  Dispense: 90 tablet; Refill: 3  Acute sinusitis, recurrence not specified, unspecified location - start claritin daily for allergies.  - Continue mucinex as needed.  - z-pack.  - if no improvement in 2 weeks, or worsening, then would want to get cxr. Pt agreeable to plan.  - f/u prn    Hyperlipidemia, mixed - refills on atorvastatin provided today.  - f/u yearly at physical.   Temporal pain - continue f/u with rheum and ENT for potential temporal arteritis and steroid management.  - discussed narcotic use with pt today and I am willing to cover her for 1 script of narcotic while she is having evlaution for pain. If  narcotic need turns out to be > 6 weeks, pt will need to sign a pain contract and have UDS completed prior to refills. She is understanding of law and requirement. Database reviewed today and appropriate.   Reviewed expectations re: course of current medical issues.  Discussed self-management of symptoms.  Outlined signs and symptoms indicating need for more acute intervention.  Patient verbalized understanding and all questions were answered.  Patient received an After-Visit Summary.    Note is dictated utilizing voice recognition software. Although note has been proof read prior to signing, occasional typographical errors still can be missed. If any questions arise, please do not hesitate to call for verification.   electronically signed by:  Howard Pouch, DO  [place]

## 2017-03-18 NOTE — Patient Instructions (Addendum)
I have refilled the mobic. Take with food. No ibuprofen/aleve etc when taking this medicine.   I refilled your Statin.   Continue the mucinex, start the loratadine (claritin) every day, z-pack also prescribed. If symptoms do not get better with above in 2-3 weeks, then would want to get a chest xray.      I refilled your pain medication for head, if this needs refilled we will need you to come in and perform urine test plus sign a pain contract--> this is an new state law we have to do it.

## 2017-03-21 DIAGNOSIS — Z79899 Other long term (current) drug therapy: Secondary | ICD-10-CM | POA: Diagnosis not present

## 2017-03-21 DIAGNOSIS — I160 Hypertensive urgency: Secondary | ICD-10-CM | POA: Diagnosis not present

## 2017-03-21 DIAGNOSIS — I10 Essential (primary) hypertension: Secondary | ICD-10-CM | POA: Diagnosis not present

## 2017-03-21 DIAGNOSIS — E785 Hyperlipidemia, unspecified: Secondary | ICD-10-CM | POA: Diagnosis not present

## 2017-03-21 DIAGNOSIS — R51 Headache: Secondary | ICD-10-CM | POA: Diagnosis not present

## 2017-03-21 DIAGNOSIS — G4489 Other headache syndrome: Secondary | ICD-10-CM | POA: Diagnosis not present

## 2017-03-21 DIAGNOSIS — G319 Degenerative disease of nervous system, unspecified: Secondary | ICD-10-CM | POA: Diagnosis not present

## 2017-03-21 DIAGNOSIS — G500 Trigeminal neuralgia: Secondary | ICD-10-CM | POA: Diagnosis not present

## 2017-03-21 DIAGNOSIS — Z7982 Long term (current) use of aspirin: Secondary | ICD-10-CM | POA: Diagnosis not present

## 2017-03-21 DIAGNOSIS — Z87891 Personal history of nicotine dependence: Secondary | ICD-10-CM | POA: Diagnosis not present

## 2017-03-22 DIAGNOSIS — I160 Hypertensive urgency: Secondary | ICD-10-CM | POA: Diagnosis not present

## 2017-03-22 DIAGNOSIS — G319 Degenerative disease of nervous system, unspecified: Secondary | ICD-10-CM | POA: Diagnosis not present

## 2017-03-22 DIAGNOSIS — G500 Trigeminal neuralgia: Secondary | ICD-10-CM | POA: Diagnosis not present

## 2017-03-22 DIAGNOSIS — G44091 Other trigeminal autonomic cephalgias (TAC), intractable: Secondary | ICD-10-CM | POA: Diagnosis not present

## 2017-03-22 DIAGNOSIS — I10 Essential (primary) hypertension: Secondary | ICD-10-CM | POA: Diagnosis not present

## 2017-03-22 DIAGNOSIS — R51 Headache: Secondary | ICD-10-CM | POA: Diagnosis not present

## 2017-03-24 ENCOUNTER — Other Ambulatory Visit: Payer: Self-pay | Admitting: Otolaryngology

## 2017-03-25 DIAGNOSIS — H5712 Ocular pain, left eye: Secondary | ICD-10-CM | POA: Diagnosis not present

## 2017-03-30 ENCOUNTER — Other Ambulatory Visit: Payer: Self-pay | Admitting: Family Medicine

## 2017-04-02 ENCOUNTER — Encounter (HOSPITAL_BASED_OUTPATIENT_CLINIC_OR_DEPARTMENT_OTHER): Payer: Self-pay | Admitting: *Deleted

## 2017-04-06 ENCOUNTER — Encounter (HOSPITAL_BASED_OUTPATIENT_CLINIC_OR_DEPARTMENT_OTHER): Payer: Self-pay | Admitting: *Deleted

## 2017-04-06 ENCOUNTER — Ambulatory Visit (HOSPITAL_BASED_OUTPATIENT_CLINIC_OR_DEPARTMENT_OTHER): Payer: Medicare Other | Admitting: Anesthesiology

## 2017-04-06 ENCOUNTER — Ambulatory Visit (HOSPITAL_BASED_OUTPATIENT_CLINIC_OR_DEPARTMENT_OTHER)
Admission: RE | Admit: 2017-04-06 | Discharge: 2017-04-06 | Disposition: A | Discharge status: Home / Self Care | Payer: Medicare Other | Source: Ambulatory Visit | Attending: Otolaryngology | Admitting: Otolaryngology

## 2017-04-06 ENCOUNTER — Encounter (HOSPITAL_BASED_OUTPATIENT_CLINIC_OR_DEPARTMENT_OTHER)
Admission: RE | Disposition: A | Discharge status: Home / Self Care | Payer: Self-pay | Source: Ambulatory Visit | Attending: Otolaryngology

## 2017-04-06 DIAGNOSIS — Z87891 Personal history of nicotine dependence: Secondary | ICD-10-CM | POA: Diagnosis not present

## 2017-04-06 DIAGNOSIS — I739 Peripheral vascular disease, unspecified: Secondary | ICD-10-CM | POA: Diagnosis not present

## 2017-04-06 DIAGNOSIS — I10 Essential (primary) hypertension: Secondary | ICD-10-CM | POA: Insufficient documentation

## 2017-04-06 DIAGNOSIS — I2510 Atherosclerotic heart disease of native coronary artery without angina pectoris: Secondary | ICD-10-CM | POA: Insufficient documentation

## 2017-04-06 DIAGNOSIS — E785 Hyperlipidemia, unspecified: Secondary | ICD-10-CM | POA: Diagnosis not present

## 2017-04-06 DIAGNOSIS — R51 Headache: Secondary | ICD-10-CM | POA: Diagnosis not present

## 2017-04-06 DIAGNOSIS — M316 Other giant cell arteritis: Secondary | ICD-10-CM | POA: Diagnosis not present

## 2017-04-06 HISTORY — PX: ARTERY BIOPSY: SHX891

## 2017-04-06 LAB — POCT I-STAT, CHEM 8
BUN: 23 mg/dL — ABNORMAL HIGH (ref 6–20)
Calcium, Ion: 1.19 mmol/L (ref 1.15–1.40)
Chloride: 104 mmol/L (ref 101–111)
Creatinine, Ser: 0.6 mg/dL (ref 0.44–1.00)
Glucose, Bld: 108 mg/dL — ABNORMAL HIGH (ref 65–99)
HEMATOCRIT: 38 % (ref 36.0–46.0)
HEMOGLOBIN: 12.9 g/dL (ref 12.0–15.0)
POTASSIUM: 3.1 mmol/L — AB (ref 3.5–5.1)
SODIUM: 139 mmol/L (ref 135–145)
TCO2: 27 mmol/L (ref 0–100)

## 2017-04-06 SURGERY — BIOPSY TEMPORAL ARTERY
Anesthesia: Monitor Anesthesia Care | Site: Face | Laterality: Left

## 2017-04-06 MED ORDER — FENTANYL CITRATE (PF) 100 MCG/2ML IJ SOLN
100.0000 ug | INTRAMUSCULAR | Status: DC | PRN
  Administered 2017-04-06: 50 ug via INTRAVENOUS
  Administered 2017-04-06: 25 ug via INTRAVENOUS

## 2017-04-06 MED ORDER — FENTANYL CITRATE (PF) 100 MCG/2ML IJ SOLN
INTRAMUSCULAR | Status: AC
  Filled 2017-04-06: qty 2

## 2017-04-06 MED ORDER — MIDAZOLAM HCL 2 MG/2ML IJ SOLN: 1.0000 mg | INTRAMUSCULAR | Status: DC | PRN

## 2017-04-06 MED ORDER — LIDOCAINE 2% (20 MG/ML) 5 ML SYRINGE
INTRAMUSCULAR | Status: DC | PRN
  Administered 2017-04-06: 60 mg via INTRAVENOUS

## 2017-04-06 MED ORDER — OXYMETAZOLINE HCL 0.05 % NA SOLN
NASAL | Status: AC
  Filled 2017-04-06: qty 15

## 2017-04-06 MED ORDER — FENTANYL CITRATE (PF) 100 MCG/2ML IJ SOLN
50.0000 ug | INTRAMUSCULAR | Status: AC | PRN
  Administered 2017-04-06: 50 ug via INTRAVENOUS
  Administered 2017-04-06 (×2): 25 ug via INTRAVENOUS

## 2017-04-06 MED ORDER — AMOXICILLIN 875 MG PO TABS: 875.0000 mg | ORAL_TABLET | Freq: Two times a day (BID) | ORAL | 0 refills | Status: AC

## 2017-04-06 MED ORDER — SCOPOLAMINE 1 MG/3DAYS TD PT72: 1.0000 | MEDICATED_PATCH | Freq: Once | TRANSDERMAL | Status: DC | PRN

## 2017-04-06 MED ORDER — LIDOCAINE-EPINEPHRINE 1 %-1:100000 IJ SOLN
INTRAMUSCULAR | Status: DC | PRN
  Administered 2017-04-06: 1.75 mL

## 2017-04-06 MED ORDER — LACTATED RINGERS IV SOLN
INTRAVENOUS | Status: DC
  Administered 2017-04-06: 09:00:00 via INTRAVENOUS

## 2017-04-06 MED ORDER — CEFAZOLIN SODIUM-DEXTROSE 2-3 GM-% IV SOLR
INTRAVENOUS | Status: DC | PRN
  Administered 2017-04-06: 2 g via INTRAVENOUS

## 2017-04-06 MED ORDER — PROPOFOL 500 MG/50ML IV EMUL
INTRAVENOUS | Status: DC | PRN
  Administered 2017-04-06: 75 ug/kg/min via INTRAVENOUS

## 2017-04-06 MED ORDER — LIDOCAINE-EPINEPHRINE 1 %-1:100000 IJ SOLN
INTRAMUSCULAR | Status: AC
  Filled 2017-04-06: qty 1

## 2017-04-06 SURGICAL SUPPLY — 54 items
BANDAGE ADH SHEER 1  50/CT (GAUZE/BANDAGES/DRESSINGS) IMPLANT
BENZOIN TINCTURE PRP APPL 2/3 (GAUZE/BANDAGES/DRESSINGS) IMPLANT
BLADE CLIPPER SURG (BLADE) ×3 IMPLANT
BLADE SURG 15 STRL LF DISP TIS (BLADE) ×1 IMPLANT
BLADE SURG 15 STRL SS (BLADE) ×2
CANISTER SUCT 1200ML W/VALVE (MISCELLANEOUS) IMPLANT
CLEANER CAUTERY TIP 5X5 PAD (MISCELLANEOUS) IMPLANT
CLOSURE WOUND 1/2 X4 (GAUZE/BANDAGES/DRESSINGS)
CORDS BIPOLAR (ELECTRODE) IMPLANT
COVER BACK TABLE 60X90IN (DRAPES) ×3 IMPLANT
COVER MAYO STAND STRL (DRAPES) ×3 IMPLANT
COVER PROBE W GEL 5X96 (DRAPES) ×3 IMPLANT
DERMABOND ADVANCED (GAUZE/BANDAGES/DRESSINGS) ×2
DERMABOND ADVANCED .7 DNX12 (GAUZE/BANDAGES/DRESSINGS) ×1 IMPLANT
DRAPE SURG 17X23 STRL (DRAPES) IMPLANT
DRAPE U-SHAPE 76X120 STRL (DRAPES) ×3 IMPLANT
ELECT COATED BLADE 2.86 ST (ELECTRODE) ×3 IMPLANT
ELECT NEEDLE BLADE 2-5/6 (NEEDLE) IMPLANT
ELECT REM PT RETURN 9FT ADLT (ELECTROSURGICAL)
ELECTRODE REM PT RTRN 9FT ADLT (ELECTROSURGICAL) IMPLANT
GAUZE SPONGE 4X4 12PLY STRL LF (GAUZE/BANDAGES/DRESSINGS) IMPLANT
GLOVE BIO SURGEON STRL SZ7.5 (GLOVE) ×3 IMPLANT
GLOVE BIOGEL PI IND STRL 6.5 (GLOVE) ×1 IMPLANT
GLOVE BIOGEL PI INDICATOR 6.5 (GLOVE) ×2
GLOVE SURG SS PI 7.0 STRL IVOR (GLOVE) ×3 IMPLANT
GOWN STRL REUS W/ TWL LRG LVL3 (GOWN DISPOSABLE) ×2 IMPLANT
GOWN STRL REUS W/TWL LRG LVL3 (GOWN DISPOSABLE) ×4
NEEDLE HYPO 25X1 1.5 SAFETY (NEEDLE) IMPLANT
NEEDLE PRECISIONGLIDE 27X1.5 (NEEDLE) ×3 IMPLANT
NS IRRIG 1000ML POUR BTL (IV SOLUTION) ×3 IMPLANT
PACK BASIN DAY SURGERY FS (CUSTOM PROCEDURE TRAY) ×3 IMPLANT
PAD CLEANER CAUTERY TIP 5X5 (MISCELLANEOUS)
PENCIL BUTTON HOLSTER BLD 10FT (ELECTRODE) ×3 IMPLANT
SHEET MEDIUM DRAPE 40X70 STRL (DRAPES) ×3 IMPLANT
SPONGE GAUZE 2X2 8PLY STER LF (GAUZE/BANDAGES/DRESSINGS)
SPONGE GAUZE 2X2 8PLY STRL LF (GAUZE/BANDAGES/DRESSINGS) IMPLANT
STRIP CLOSURE SKIN 1/2X4 (GAUZE/BANDAGES/DRESSINGS) IMPLANT
SUCTION FRAZIER HANDLE 10FR (MISCELLANEOUS)
SUCTION TUBE FRAZIER 10FR DISP (MISCELLANEOUS) IMPLANT
SUT CHROMIC 4 0 P 3 18 (SUTURE) IMPLANT
SUT ETHILON 5 0 P 3 18 (SUTURE)
SUT NYLON ETHILON 5-0 P-3 1X18 (SUTURE) IMPLANT
SUT SILK 3 0 TIES 17X18 (SUTURE) ×2
SUT SILK 3-0 18XBRD TIE BLK (SUTURE) ×1 IMPLANT
SUT SILK 4 0 TIES 17X18 (SUTURE) IMPLANT
SUT VIC AB 4-0 P-3 18XBRD (SUTURE) ×1 IMPLANT
SUT VIC AB 4-0 P3 18 (SUTURE) ×2
SUT VICRYL 4-0 PS2 18IN ABS (SUTURE) IMPLANT
SWABSTICK POVIDONE IODINE SNGL (MISCELLANEOUS) IMPLANT
SYR CONTROL 10ML LL (SYRINGE) ×3 IMPLANT
TOWEL OR 17X24 6PK STRL BLUE (TOWEL DISPOSABLE) ×3 IMPLANT
TRAY DSU PREP LF (CUSTOM PROCEDURE TRAY) ×3 IMPLANT
TUBE CONNECTING 20'X1/4 (TUBING)
TUBE CONNECTING 20X1/4 (TUBING) IMPLANT

## 2017-04-06 NOTE — Anesthesia Procedure Notes (Signed)
Procedure Name: MAC Date/Time: 04/06/2017 10:42 AM Performed by: Lieutenant Diego Pre-anesthesia Checklist: Patient identified, Timeout performed, Emergency Drugs available, Suction available and Patient being monitored Patient Re-evaluated:Patient Re-evaluated prior to inductionOxygen Delivery Method: Simple face mask Preoxygenation: Pre-oxygenation with 100% oxygen Intubation Type: IV induction

## 2017-04-06 NOTE — Op Note (Signed)
DATE OF PROCEDURE:  04/06/2017                              OPERATIVE REPORT  SURGEON:  Leta Baptist, MD  PREOPERATIVE DIAGNOSES: Temporal headaches  POSTOPERATIVE DIAGNOSES: Temporal headaches  PROCEDURE PERFORMED: Left temporal artery biopsy  ANESTHESIA:  IV sedation with local anesthesia  COMPLICATIONS:  None.  ESTIMATED BLOOD LOSS:  Minimal.  INDICATION FOR PROCEDURE:   [name] is a 76 y.o. female with a history of chronic left temporal headaches. Her history was suggestive of left temporal arteritis. Based on the above findings, the decision was made to perform the temporal artery biopsy. The risks, benefits, alternatives, and details of the procedure were discussed with the patient.  Questions were invited and answered.  Informed consent was obtained.  DESCRIPTION:  The patient was taken to the operating room and placed supine on the operating table. IV sedation was administered by the anesthesiologist. 1% lidocaine with 1-100,000 epinephrine was infiltrated around the left temporal area. The left temporal artery was identified with a Doppler device. A vertical incision was made over the artery. The incision was carried down to the level of the temporal artery. A 2 cm segment of the temporal artery was then removed. Hemostasis was achieved with suture ligature and Bovie electrocautery device. The surgical site was copiously irrigated. The incision was closed in layers with 4-0 Vicryl and Dermabond.   The care of the patient was turned over to the anesthesiologist.  The patient was awakened from sedation without difficulty.  The patient was transferred to the recovery room in good condition.  OPERATIVE FINDINGS:  A 2cm segment of the left temporal artery was removed.  SPECIMEN:  Left temporal artery.  FOLLOWUP CARE:  The patient will be discharged home once she is awake and alert.  Khyrin Trevathan WOOI 04/06/2017

## 2017-04-06 NOTE — Anesthesia Postprocedure Evaluation (Addendum)
Anesthesia Post Note  Patient: Edward Qualia  Procedure(s) Performed: Procedure(s) (LRB): BIOPSY TEMPORAL ARTERY (Left)  Patient location during evaluation: PACU Anesthesia Type: MAC Level of consciousness: awake and alert Pain management: pain level controlled Vital Signs Assessment: post-procedure vital signs reviewed and stable Respiratory status: spontaneous breathing, nonlabored ventilation, respiratory function stable and patient connected to nasal cannula oxygen Cardiovascular status: stable and blood pressure returned to baseline Anesthetic complications: no       Last Vitals:  Vitals:   04/06/17 1215 04/06/17 1300  BP: (!) 167/64 (!) 151/80  Pulse: (!) 58 61  Resp: 18 20  Temp:  36.6 C    Last Pain:  Vitals:   04/06/17 1300  TempSrc:   PainSc: 3                  Jazmyn Offner,JAMES TERRILL

## 2017-04-06 NOTE — Anesthesia Preprocedure Evaluation (Signed)
Anesthesia Evaluation  Patient identified by MRN, date of birth, ID band Patient awake    Reviewed: Allergy & Precautions  History of Anesthesia Complications Negative for: history of anesthetic complications  Airway Mallampati: II   Neck ROM: Full    Dental no notable dental hx.    Pulmonary former smoker,    breath sounds clear to auscultation       Cardiovascular hypertension, + CAD and + Peripheral Vascular Disease   Rhythm:Regular Rate:Normal     Neuro/Psych    GI/Hepatic   Endo/Other  Hypothyroidism   Renal/GU      Musculoskeletal  (+) Arthritis ,   Abdominal   Peds  Hematology   Anesthesia Other Findings   Reproductive/Obstetrics                             Anesthesia Physical Anesthesia Plan  ASA: III  Anesthesia Plan: MAC   Post-op Pain Management:    Induction: Intravenous  Airway Management Planned: Natural Airway  Additional Equipment:   Intra-op Plan:   Post-operative Plan:   Informed Consent: I have reviewed the patients History and Physical, chart, labs and discussed the procedure including the risks, benefits and alternatives for the proposed anesthesia with the patient or authorized representative who has indicated his/her understanding and acceptance.     Plan Discussed with: CRNA  Anesthesia Plan Comments:         Anesthesia Quick Evaluation

## 2017-04-06 NOTE — Discharge Instructions (Addendum)
The patient may resume all her previous activities and diet. She will follow-up in my office in one week.       Post Anesthesia Home Care Instructions  Activity: Get plenty of rest for the remainder of the day. A responsible individual must stay with you for 24 hours following the procedure.  For the next 24 hours, DO NOT: -Drive a car -Paediatric nurse -Drink alcoholic beverages -Take any medication unless instructed by your physician -Make any legal decisions or sign important papers.  Meals: Start with liquid foods such as gelatin or soup. Progress to regular foods as tolerated. Avoid greasy, spicy, heavy foods. If nausea and/or vomiting occur, drink only clear liquids until the nausea and/or vomiting subsides. Call your physician if vomiting continues.  Special Instructions/Symptoms: Your throat may feel dry or sore from the anesthesia or the breathing tube placed in your throat during surgery. If this causes discomfort, gargle with warm salt water. The discomfort should disappear within 24 hours.  If you had a scopolamine patch placed behind your ear for the management of post- operative nausea and/or vomiting:  1. The medication in the patch is effective for 72 hours, after which it should be removed.  Wrap patch in a tissue and discard in the trash. Wash hands thoroughly with soap and water. 2. You may remove the patch earlier than 72 hours if you experience unpleasant side effects which may include dry mouth, dizziness or visual disturbances. 3. Avoid touching the patch. Wash your hands with soap and water after contact with the patch.

## 2017-04-06 NOTE — Transfer of Care (Signed)
Immediate Anesthesia Transfer of Care Note  Patient: [name]  Procedure(s) Performed: Procedure(s): BIOPSY TEMPORAL ARTERY (Left)  Patient Location: PACU  Anesthesia Type:MAC  Level of Consciousness: awake and alert   Airway & Oxygen Therapy: Patient Spontanous Breathing and Patient connected to face mask oxygen  Post-op Assessment: Report given to RN and Post -op Vital signs reviewed and stable  Post vital signs: Reviewed and stable  Last Vitals:  Vitals:   04/06/17 0841  BP: (!) 147/94  Pulse: 61  Resp: 20  Temp: 36.7 C    Last Pain:  Vitals:   04/06/17 0841  TempSrc: Oral  PainSc: 5          Complications: No apparent anesthesia complications

## 2017-04-06 NOTE — H&P (Signed)
Cc:  Temporal headaches  HPI: The patient is a 76 y/o female who presents today for evaluation of persistent left temporal headaches. The patient is seen in consultation requested by [facility]. The patient has noted left temporal headaches for the past 6 weeks. She was seen by her rheumatologist who recommended a temporal artery biopsy. The patient currently denies any change in her vision. She is currently on prednisone. The patient is taking Norco every 4 hours to control her headaches. 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 She recently underwent a brain MRI which was negative.   The patient's review of systems (constitutional, eyes, ENT, cardiovascular, respiratory, GI, musculoskeletal, skin, neurologic, psychiatric, endocrine, hematologic, allergic) is noted in the ROS questionnaire.  It is reviewed with the patient.   Family health history: none.  Major events: Angioplasty.  Ongoing medical problems: Cataracts, loss of vision, hypertension, irregular pulse, chronic bronchitis, reflux, arthritis, osteoporosis, fibromyalgia, headache.  Social history: The patient is married.  She denies the use of tobacco, alcohol, and illegal drugs.  Exam General: Communicates without difficulty, well nourished, no acute distress. Head: Normocephalic, no evidence injury, no tenderness, facial buttresses intact without stepoff. Eyes: PERRL, EOMI. No scleral icterus, conjunctivae clear. Neuro: CN II exam reveals vision grossly intact.  No nystagmus at any point of gaze. Ears: Auricles well formed without lesions.  Ear canals are intact without mass or lesion.  No erythema or edema is appreciated.  The TMs are intact without fluid. Nose: External evaluation reveals normal support and skin without lesions.  Dorsum is intact.  Anterior rhinoscopy reveals healthy pink mucosa over anterior aspect of inferior turbinates and intact septum.  No purulence noted. Oral:  Oral cavity and  oropharynx are intact, symmetric, without erythema or edema.  Mucosa is moist without lesions. Neck: Full range of motion without pain.  There is no significant lymphadenopathy.  No masses palpable.  Thyroid bed within normal limits to palpation.  Parotid glands and submandibular glands equal bilaterally without mass.  Trachea is midline. Neuro:  CN 2-12 grossly intact. Gait normal. Vestibular: No nystagmus at any point of gaze.   Assessment History of left sided temporal headaches with possibility of temporal arteritis.   Plan  1.  Recommend temporal artery biopsy for definitive diagnosis.  2.  The risks, benefits, alternatives, and details of the procedure are reviewed with the patient. Questions are invited and answered. 3.  The patient is interested in proceeding with the procedure.  We will schedule the procedure in accordance with the patient's schedule.

## 2017-04-07 ENCOUNTER — Encounter (HOSPITAL_BASED_OUTPATIENT_CLINIC_OR_DEPARTMENT_OTHER): Payer: Self-pay | Admitting: Otolaryngology

## 2017-04-10 ENCOUNTER — Ambulatory Visit (INDEPENDENT_AMBULATORY_CARE_PROVIDER_SITE_OTHER): Payer: Self-pay | Admitting: Otolaryngology

## 2017-04-13 ENCOUNTER — Ambulatory Visit (INDEPENDENT_AMBULATORY_CARE_PROVIDER_SITE_OTHER): Payer: Medicare Other | Admitting: Family Medicine

## 2017-04-13 ENCOUNTER — Encounter: Payer: Self-pay | Admitting: Family Medicine

## 2017-04-13 VITALS — BP 136/82 | HR 73 | Temp 98.1°F | Resp 20 | Wt 179.2 lb

## 2017-04-13 DIAGNOSIS — R09A2 Foreign body sensation, throat: Secondary | ICD-10-CM

## 2017-04-13 DIAGNOSIS — R05 Cough: Secondary | ICD-10-CM

## 2017-04-13 DIAGNOSIS — R51 Headache: Secondary | ICD-10-CM

## 2017-04-13 DIAGNOSIS — F458 Other somatoform disorders: Secondary | ICD-10-CM

## 2017-04-13 DIAGNOSIS — R059 Cough, unspecified: Secondary | ICD-10-CM

## 2017-04-13 DIAGNOSIS — M2669 Other specified disorders of temporomandibular joint: Secondary | ICD-10-CM | POA: Diagnosis not present

## 2017-04-13 DIAGNOSIS — R634 Abnormal weight loss: Secondary | ICD-10-CM

## 2017-04-13 DIAGNOSIS — R0989 Other specified symptoms and signs involving the circulatory and respiratory systems: Secondary | ICD-10-CM

## 2017-04-13 DIAGNOSIS — R432 Parageusia: Secondary | ICD-10-CM

## 2017-04-13 DIAGNOSIS — R519 Headache, unspecified: Secondary | ICD-10-CM

## 2017-04-13 NOTE — Patient Instructions (Signed)
Get the name of the neurologist you want me to refer you.  I will call you to discuss meds after we call your pharmacy.   You will need to continue the prednisone until we can taper off. Needs to go to steady and slow.   May either start gabapentin or another med to try to help with headache.

## 2017-04-13 NOTE — Progress Notes (Signed)
[name] , 1940/12/07, 76 y.o., female MRN: [id number] Patient Care Team    Relationship Specialty Notifications Start End  Ma Hillock, DO PCP - General Family Medicine  11/06/16   Augusto Garbe, MD Referring Physician Cardiology  11/06/16   Rosana Berger, MD Referring Physician Gastroenterology  11/06/16   Vernie Ammons, MD Referring Physician Dermatology  11/06/16   [facility]  Optometry  11/06/16   Paralee Cancel, MD Consulting Physician Orthopedic Surgery  11/06/16   Ernst Spell, MD Referring Physician Urology  11/06/16   Hennie Duos, MD Consulting Physician Rheumatology  03/18/17   Calvert Cantor, MD Consulting Physician Ophthalmology  03/18/17     Chief Complaint  Patient presents with  . Headache    Subjective:  Patient presents today for follow up after her temporal artery biopsy. Her biopsy was negative. She does not have rheumatology follow-up for an additional 4 weeks (June 19 at 11 AM). She does not endorse any relief from discomfort with the use of prednisone, which she has continued 50 mg daily and had refills from rheumatology. She continues to have left temporal pain, left jaw pain. She also endorses sensation in her throat and decreased sense of taste. She denies any dysphagia with either food or liquid. She feels her voice is fluctuating. She is still unable to sleep secondary to pain. She reports she stopped the gabapentin, she is uncertain what dose, or who prescribed, because it was not working. She states she has also started feeling a a bronchitis-like tightness in her chest.She endorses unintentional weight loss of approximately 10 pounds, she states is because decreased taste perception  prior note 03/18/2017: She was seen acutely last week after 4 weeks of left-sided orbit, temporal and jaw claudication. Patient had elevated ESR of 74, 3 weeks prior. Patient was urgently worked into Dr. Bing Plume, ophthalmologist  same day with normal evaluation, no ischemia. 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 She is scheduled with rheumatology next week to further work up potential of temporal arteritis. She was started on 60 mg of prednisone daily, and is tolerating the medication, however she is still having left temporal pain. She has been taking Vicodin 10-325 she was provided by another provider for her back pain, and this is not resolving her eye pain. Patient is still complaining of left temporal pain, left orbit pain, and left jaw claudication. Patient called in the office yesterday requesting something for pain.   Prior note 02/25/2017: Pt presents for an  OV with complaints of Left-sided temporal headache of 1 month duration.  Patient states she was out gardening and portal weed and the daughter hit above her forehead just prior to onset of headache, and originally she thought that was the cause. However given the month's duration of headache she decided to come in today. She states the pain is located over the left side of her temporal area and she is having left high pressure. She denies eye pain, visual changes, light flashes, light sensitivity, nausea or vomiting.  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 She does admit to the original symptoms from her HSV infection in her right eye, are very similar to her symptoms that she is presently experiencing in her left eye. She is not followed routinely  by ophthalmologist, her optometrist is in [place].  She had been experiencing jaw claudication over the last few weeks and called her cardiologist, cardiac cause was ruled out. She does have a history of coronary artery disease with angioplasty/stent placement and hypertension, she is compliant with daily baby aspirin, high-dose statin, lisinopril,  atenolol and verapamil. She was seen approximately 4 weeks ago in this office with increasing arthritic complaints of her mid back, left shoulder and hands. At that time she did not complain of any headaches or eye complaints. A rheumatological workup was initiated with a sedimentation rate of 74 (high). CMP, CRP, rheumatoid factor, CCP were normal. Thoracic spine x-ray resulted with evidence of lumbar disc degeneration. She states the headache was bothering her so bad she took one of her old hydrocodone pills, which didn't seem to be very helpful. She took a Brewing technologist powder "and this helped more.   Recent Results (from the past 2160 hour(s))  C-reactive protein     Status: None   Collection Time: 01/26/17  2:16 PM  Result Value Ref Range   CRP 2.5 <8.0 mg/L  Sed Rate (ESR)     Status: Abnormal   Collection Time: 01/26/17  2:16 PM  Result Value Ref Range   Sed Rate 74 (H) 0 - 30 mm/hr  BASIC METABOLIC PANEL WITH GFR     Status: Abnormal   Collection Time: 01/26/17  2:16 PM  Result Value Ref Range   Sodium 138 135 - 146 mmol/L   Potassium 4.2 3.5 - 5.3 mmol/L   Chloride 103 98 - 110 mmol/L   CO2 26 20 - 31 mmol/L   Glucose, Bld 81 65 - 99 mg/dL   BUN 14 7 - 25 mg/dL   Creat 0.57 (L) 0.60 - 0.93 mg/dL    Comment:   For patients > or = 74 years of age: The upper reference limit for Creatinine is approximately 13% higher for people identified as African-American.      Calcium 9.7 8.6 - 10.4 mg/dL   GFR, Est African American >89 >=60 mL/min   GFR, Est Non African American >89 >=60 mL/min  Rheumatoid Factor     Status: None   Collection Time: 01/26/17  2:16 PM  Result Value Ref Range   Rhuematoid fact SerPl-aCnc <58 <52 IU/mL  Cyclic citrul peptide antibody, IgG     Status: None   Collection Time: 01/26/17  2:16 PM  Result Value Ref Range   Cyclic Citrullin Peptide Ab <16 Units    Comment:   Reference Range Negative               < 20 Weak Positive            20 - 39 Moderate  Positive        40 - 59 Strong Positive        > 59   I-STAT, chem 8     Status: Abnormal   Collection Time: 04/06/17  9:02 AM  Result Value Ref Range   Sodium 139 135 - 145 mmol/L   Potassium 3.1 (L) 3.5 - 5.1 mmol/L   Chloride 104 101 - 111 mmol/L   BUN 23 (H) 6 - 20 mg/dL   Creatinine, Ser 0.60 0.44 - 1.00 mg/dL   Glucose, Bld 108 (H) 65 - 99 mg/dL   Calcium, Ion 1.19 1.15 - 1.40 mmol/L   TCO2 27 0 - 100 mmol/L   Hemoglobin 12.9 12.0 - 15.0 g/dL   HCT 38.0  36.0 - 46.0 %    Depression screen [facility] 2/9 11/06/2016 05/05/2016 10/25/2013  Decreased Interest 0 0 0  Down, Depressed, Hopeless 0 0 0  PHQ - 2 Score 0 0 0    Allergies  Allergen Reactions  . Ezetimibe     Could not tolerate myalgias   Social History  Substance Use Topics  . Smoking status: Former Smoker    Types: Cigarettes    Quit date: 03/02/2009  . Smokeless tobacco: Never Used  . Alcohol use Yes     Comment: very rarely   Past Medical History:  Diagnosis Date  . ANEMIA 08/23/2008  . Anxiety 09/21/2012  . Arthritis 11/25/2011  . BACK PAIN, LUMBAR, CHRONIC 02/18/2010  . CORONARY ARTERY DISEASE 08/23/2008  . Cystic acne 11/04/2012  . Ganglion cyst 11/04/2012   Right forearm and left wrist.  . GERD 08/23/2008  . 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 HTN (hypertension) 09/21/2012  . HYPERLIPIDEMIA 08/23/2008  . HYPOTHYROIDISM 08/23/2008  . Kidney stone 08/12/2013  . OSTEOPENIA 08/23/2008   declines further studies  . Vitamin D deficiency 04/28/2015   Past Surgical History:  Procedure Laterality Date  . [place]  . ARTERY BIOPSY Left 04/06/2017   Procedure: BIOPSY TEMPORAL ARTERY;  Surgeon: Leta Baptist, MD;  Location: [place];  Service: ENT;  Laterality: Left;  . CHOLECYSTECTOMY    . CORONARY STENT PLACEMENT  08/2010   MRI conditional; ok 1.5-3 tesla   Family History  Problem Relation Age of Onset  . Dementia Mother   .  Arthritis Mother        rheumatoid  . Heart disease Mother        chf  . Osteoporosis Mother        broken hip  . Heart disease Father        massive MI  . Heart attack Father   . Hyperlipidemia Sister   . Hyperlipidemia Brother   . Heart disease Daughter        2 MIs, Pulmonic Valve disease  . Hyperlipidemia Daughter   . Hyperlipidemia Son   . Hypertension Son   . Cancer Maternal Grandmother        gyn  . Heart disease Maternal Grandmother        CHF  . Alcohol abuse Paternal Grandfather   . Dementia Paternal Grandfather   . Brain cancer Brother        brain tumor  . Breast cancer Maternal Aunt        breast   Allergies as of 04/13/2017      Reactions   Ezetimibe    Could not tolerate myalgias      Medication List       Accurate as of 04/13/17  2:56 PM. Always use your most recent med list.          acyclovir 400 MG tablet Commonly known as:  ZOVIRAX Take 1 tablet by mouth two  times daily x 25 days.Then take 5 tablets daily x 5 days.   ALPRAZolam 0.5 MG tablet Commonly known as:  XANAX Take 0.5 tablets (0.25 mg total) by mouth 2 (two) times daily as needed for sleep or anxiety.   atenolol 50 MG tablet Commonly known as:  TENORMIN Take 1 tablet (50 mg total) by mouth daily.   atorvastatin 80 MG tablet Commonly known as:  LIPITOR Take 1 tablet (80 mg total) by  mouth daily at 6 PM.   fish oil-omega-3 fatty acids 1000 MG capsule Take 2 g by mouth daily.   lisinopril 10 MG tablet Commonly known as:  PRINIVIL,ZESTRIL Take 1 tablet (10 mg total) by mouth daily.   loratadine 10 MG tablet Commonly known as:  CLARITIN Take 1 tablet (10 mg total) by mouth daily.   LORCET 10/650 PO Take 1 tablet by mouth every 6 (six) hours as needed.   HYDROcodone-acetaminophen 10-325 MG tablet Commonly known as:  NORCO Take 1 tablet by mouth every 6 (six) hours as needed.   meloxicam 15 MG tablet Commonly known as:  MOBIC Take 1 tablet (15 mg total) by mouth daily.    nitroGLYCERIN 0.4 MG SL tablet Commonly known as:  NITROSTAT Place 1 tablet (0.4 mg total) under the tongue every 5 (five) minutes as needed for chest pain.   omeprazole 40 MG capsule Commonly known as:  PRILOSEC Take 1 capsule (40 mg total) by mouth daily.   prednisoLONE acetate 1 % ophthalmic suspension Commonly known as:  PRED FORTE Place 1 drop into the right eye daily as needed.   predniSONE 20 MG tablet Commonly known as:  DELTASONE Take 3 tablets (60 mg total) by mouth daily with breakfast.   verapamil 40 MG tablet Commonly known as:  CALAN Take 1 tablet (40 mg total) by mouth daily.       No results found for this or any previous visit (from the past 24 hour(s)). No results found.   ROS: Negative, with the exception of above mentioned in HPI   Objective:  BP 136/82 (BP Location: Left Arm, Patient Position: Sitting, Cuff Size: Normal)   Pulse 73   Temp 98.1 F (36.7 C)   Resp 20   Wt 179 lb 4 oz (81.3 kg)   LMP 11/23/1993   SpO2 97%   BMI 31.75 kg/m  Body mass index is 31.75 kg/m.  Gen: Afebrile. No acute distress.  HENT: AT. [place].  MMM.  Eyes:Pupils Equal Round Reactive to light, Extraocular movements intact,  Conjunctiva without redness, discharge or icterus. CV: RRR, no edema. Chest: CTAB, no wheeze or crackles Abd: Soft. NTND. BS Present.  Skin: no rashes, purpura or petechiae.  Neuro: Normal gait. PERLA. EOMi. Alert. Oriented x3. Cranial nerves II through XII intact. Marland Kitchen Psych: Normal affect, dress and demeanor. Normal speech. Normal thought content and judgment.   Assessment/Plan: BRYTTANY TORTORELLI is a 76 y.o. female present for OV for Left eye pain Elevated sed rate Temporal pain Jaw claudication - Temporal artery biopsy negative. Concerning patient's symptoms are the same, with progression to decreased taste and globus sensation in her throat. - Pt had been emergently seen at ophthalmology without identified cause or signs of  inflammation/damage/ischemia.  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 Prior smoking history, globus sensation, decreased taste perception are concerning. Discussed referral to neurology/ENT for further evaluation. Patient is agreeable to plan. - Pharmacy was called and patient was on 100 mg of gabapentin daily. Will taper dose for her to 300/300/600 if able to tolerate without sedation. - Patient to continue following with rheumatology, will suggest she call them first to have them advise her on prednisone dose until her appointment in 4 weeks. Discussed with her not to stop abruptly, she would need to taper medication. Considering she has seen no benefit, could possibly start taper down on steroids but would want her to have approved with rheumatology. Currently she is prescribed 50 mg daily, through rheumatology.  -  Considered repeating sedimentation rate today, however patient had repeat at rheumatology, will defer to them for further management. - Continue PPI while while on steroids.  - MRI face/trigeminal with and without contrast did not show identifiable cause for symptoms.  - Referrals placed today. Gabapentin taper.  Reviewed expectations re: course of current medical issues.  Discussed self-management of symptoms.  Outlined signs and symptoms indicating need for more acute intervention.  Patient verbalized understanding and all questions were answered.  Patient received an After-Visit Summary.   > 25 minutes spent with patient, >50% of time spent face to face counseling and/or coordinating care.    electronically signed by:  Howard Pouch, DO  [place]

## 2017-04-14 ENCOUNTER — Telehealth: Payer: Self-pay | Admitting: Family Medicine

## 2017-04-14 DIAGNOSIS — R432 Parageusia: Secondary | ICD-10-CM | POA: Insufficient documentation

## 2017-04-14 DIAGNOSIS — R0989 Other specified symptoms and signs involving the circulatory and respiratory systems: Secondary | ICD-10-CM | POA: Insufficient documentation

## 2017-04-14 DIAGNOSIS — R634 Abnormal weight loss: Secondary | ICD-10-CM | POA: Insufficient documentation

## 2017-04-14 DIAGNOSIS — R519 Headache, unspecified: Secondary | ICD-10-CM

## 2017-04-14 DIAGNOSIS — R51 Headache: Principal | ICD-10-CM

## 2017-04-14 DIAGNOSIS — M2669 Other specified disorders of temporomandibular joint: Secondary | ICD-10-CM

## 2017-04-14 NOTE — Telephone Encounter (Signed)
Please call patient: - First, remind her of the chest x-ray that was ordered yesterday, I did not see where she had this completed yet. - Ask her the neurologist she would like referred to? I will place a today for her. -  I have also referred her to an ENT, for better evaluation over the globus sensation and decreased sense of taste. - Please ask her if she has had any recent dental work, recent dental visits or new dentures? Could consider pain is coming from Teeth/mouth/dentures. - Her current prescription of the steroid, is from the rheumatologist. I would advise her to call them for instructions on what they would like her to do with the prednisone dose considering the negative biopsy. It is likely we can taper back on that medication, but not stop it abruptly. Would want her to get their opinion first, since they're now prescribing the medication. - Lastly, I would like her to taper up the gabapentin. It appears it was gabapentin 100 mg tid. I would like her to taper to gabapentin 200/200/300 for 1 week and if symptoms are not improved only, to then increase to 300/300/400 as long as not oversedated on medication. This medication should be tapered on and off, do not stop abruptly. If needing refill she will call in, will need to know dose she ended with in order to appropriately refill for her. - Hopefully she'll get into ENT and neurology quickly. If not seen by any of the 3 specialist before 4 weeks, I would want to see her back to see how she's doing on the gabapentin.

## 2017-04-14 NOTE — Telephone Encounter (Signed)
Patient called back with name and office of neurologist (headache specialist). Dr. Gwendel Hanson with [facility] [place].

## 2017-04-14 NOTE — Telephone Encounter (Signed)
spoke with patient reviewed information. She states she didn't know about the chest xray she will go to get it .She states she will call us back about neurology referral her granddaughter is supposed to be getting the name of someone. She also requests if possible to refer her to Dr closer to her she states she lives closer to [place] than [place]. She prefers to be seen in [place] if a Dr is available there. Reviewed medication increase with patient . Patient verbalized understanding.

## 2017-04-15 ENCOUNTER — Encounter: Payer: Self-pay | Admitting: Family Medicine

## 2017-04-15 ENCOUNTER — Encounter (HOSPITAL_BASED_OUTPATIENT_CLINIC_OR_DEPARTMENT_OTHER): Payer: Self-pay | Admitting: Otolaryngology

## 2017-04-15 ENCOUNTER — Ambulatory Visit (HOSPITAL_BASED_OUTPATIENT_CLINIC_OR_DEPARTMENT_OTHER)
Admission: RE | Admit: 2017-04-15 | Discharge: 2017-04-15 | Disposition: A | Discharge status: Home / Self Care | Payer: Medicare Other | Source: Ambulatory Visit | Attending: Family Medicine | Admitting: Family Medicine

## 2017-04-15 DIAGNOSIS — R059 Cough, unspecified: Secondary | ICD-10-CM

## 2017-04-15 DIAGNOSIS — R05 Cough: Secondary | ICD-10-CM | POA: Diagnosis not present

## 2017-04-15 DIAGNOSIS — J984 Other disorders of lung: Secondary | ICD-10-CM | POA: Diagnosis not present

## 2017-04-15 DIAGNOSIS — R51 Headache: Secondary | ICD-10-CM | POA: Diagnosis not present

## 2017-04-15 NOTE — Telephone Encounter (Addendum)
-   her cxr is stable.  - I  have referred her to the neurologist she requested.  - I would like her to stay with the ENT she has an appt with if possible since it is quick and he is a great doctor. If she must change, that is her decision. Please have them send the report to me regardless.

## 2017-04-15 NOTE — Telephone Encounter (Signed)
Reviewed results with patient she verbalized understanding. I reminded her about her appt with Dr Wilburn Cornelia patient didn't remember that I gave her the information yesterday. I gave her appt information again and had her repeat it to me.

## 2017-04-16 ENCOUNTER — Telehealth: Payer: Self-pay | Admitting: Family Medicine

## 2017-04-16 DIAGNOSIS — R51 Headache: Secondary | ICD-10-CM | POA: Diagnosis not present

## 2017-04-16 DIAGNOSIS — I10 Essential (primary) hypertension: Secondary | ICD-10-CM | POA: Diagnosis not present

## 2017-04-16 NOTE — Telephone Encounter (Signed)
Returned call to patient regarding ER visit last night for Headache. Patient instructed to follow ER directions and follow up visit offered. Patient stated that she was seeing Neurologist today and that she would call back if needing to schedule appointment with PCP.

## 2017-04-16 NOTE — Telephone Encounter (Signed)
Patient went to [facility] ER last night for headache. Her bp was 221/117. They advised patient that she needs to add to her Lisinopril dosage. She is currently taking one Lisinopril & 1 Atenol in the morning. ER physician recommended patient take an additional blood pressure medication at night. Please call patient.

## 2017-04-20 DIAGNOSIS — J209 Acute bronchitis, unspecified: Secondary | ICD-10-CM | POA: Diagnosis not present

## 2017-04-20 DIAGNOSIS — I10 Essential (primary) hypertension: Secondary | ICD-10-CM | POA: Diagnosis not present

## 2017-04-20 NOTE — Telephone Encounter (Signed)
noted 

## 2017-04-21 DIAGNOSIS — R51 Headache: Secondary | ICD-10-CM | POA: Diagnosis not present

## 2017-04-21 DIAGNOSIS — R29818 Other symptoms and signs involving the nervous system: Secondary | ICD-10-CM | POA: Diagnosis not present

## 2017-04-23 NOTE — Addendum Note (Signed)
Addendum  created 04/23/17 1457 by Rica Koyanagi, MD   Sign clinical note

## 2017-04-26 DIAGNOSIS — E785 Hyperlipidemia, unspecified: Secondary | ICD-10-CM | POA: Diagnosis not present

## 2017-04-26 DIAGNOSIS — Z7982 Long term (current) use of aspirin: Secondary | ICD-10-CM | POA: Diagnosis not present

## 2017-04-26 DIAGNOSIS — Z8249 Family history of ischemic heart disease and other diseases of the circulatory system: Secondary | ICD-10-CM | POA: Diagnosis not present

## 2017-04-26 DIAGNOSIS — I313 Pericardial effusion (noninflammatory): Secondary | ICD-10-CM | POA: Diagnosis not present

## 2017-04-26 DIAGNOSIS — Z888 Allergy status to other drugs, medicaments and biological substances status: Secondary | ICD-10-CM | POA: Diagnosis not present

## 2017-04-26 DIAGNOSIS — Z791 Long term (current) use of non-steroidal anti-inflammatories (NSAID): Secondary | ICD-10-CM | POA: Diagnosis not present

## 2017-04-26 DIAGNOSIS — R05 Cough: Secondary | ICD-10-CM | POA: Diagnosis not present

## 2017-04-26 DIAGNOSIS — Z87891 Personal history of nicotine dependence: Secondary | ICD-10-CM | POA: Diagnosis not present

## 2017-04-26 DIAGNOSIS — C3482 Malignant neoplasm of overlapping sites of left bronchus and lung: Secondary | ICD-10-CM | POA: Diagnosis not present

## 2017-04-26 DIAGNOSIS — Z79899 Other long term (current) drug therapy: Secondary | ICD-10-CM | POA: Diagnosis not present

## 2017-04-26 DIAGNOSIS — I10 Essential (primary) hypertension: Secondary | ICD-10-CM | POA: Diagnosis not present

## 2017-04-26 DIAGNOSIS — R938 Abnormal findings on diagnostic imaging of other specified body structures: Secondary | ICD-10-CM | POA: Diagnosis not present

## 2017-04-26 DIAGNOSIS — R590 Localized enlarged lymph nodes: Secondary | ICD-10-CM | POA: Diagnosis not present

## 2017-04-26 DIAGNOSIS — N200 Calculus of kidney: Secondary | ICD-10-CM | POA: Diagnosis not present

## 2017-04-26 DIAGNOSIS — R51 Headache: Secondary | ICD-10-CM | POA: Diagnosis not present

## 2017-04-27 DIAGNOSIS — R918 Other nonspecific abnormal finding of lung field: Secondary | ICD-10-CM | POA: Diagnosis not present

## 2017-04-27 DIAGNOSIS — M316 Other giant cell arteritis: Secondary | ICD-10-CM | POA: Diagnosis not present

## 2017-04-27 DIAGNOSIS — J9811 Atelectasis: Secondary | ICD-10-CM | POA: Diagnosis not present

## 2017-04-27 DIAGNOSIS — R51 Headache: Secondary | ICD-10-CM | POA: Diagnosis not present

## 2017-04-28 DIAGNOSIS — I10 Essential (primary) hypertension: Secondary | ICD-10-CM | POA: Diagnosis not present

## 2017-04-28 DIAGNOSIS — K5900 Constipation, unspecified: Secondary | ICD-10-CM | POA: Diagnosis not present

## 2017-04-28 DIAGNOSIS — J9811 Atelectasis: Secondary | ICD-10-CM | POA: Diagnosis not present

## 2017-04-28 DIAGNOSIS — G9389 Other specified disorders of brain: Secondary | ICD-10-CM | POA: Diagnosis not present

## 2017-04-28 DIAGNOSIS — C3482 Malignant neoplasm of overlapping sites of left bronchus and lung: Secondary | ICD-10-CM | POA: Diagnosis not present

## 2017-04-28 DIAGNOSIS — G43901 Migraine, unspecified, not intractable, with status migrainosus: Secondary | ICD-10-CM | POA: Diagnosis not present

## 2017-04-28 DIAGNOSIS — R222 Localized swelling, mass and lump, trunk: Secondary | ICD-10-CM | POA: Diagnosis not present

## 2017-04-28 DIAGNOSIS — R9082 White matter disease, unspecified: Secondary | ICD-10-CM | POA: Diagnosis not present

## 2017-04-28 DIAGNOSIS — R51 Headache: Secondary | ICD-10-CM | POA: Diagnosis not present

## 2017-04-28 DIAGNOSIS — R911 Solitary pulmonary nodule: Secondary | ICD-10-CM | POA: Diagnosis not present

## 2017-04-28 DIAGNOSIS — R6889 Other general symptoms and signs: Secondary | ICD-10-CM | POA: Diagnosis not present

## 2017-04-28 DIAGNOSIS — R918 Other nonspecific abnormal finding of lung field: Secondary | ICD-10-CM | POA: Diagnosis not present

## 2017-04-28 DIAGNOSIS — F5102 Adjustment insomnia: Secondary | ICD-10-CM | POA: Diagnosis not present

## 2017-04-29 DIAGNOSIS — F5102 Adjustment insomnia: Secondary | ICD-10-CM | POA: Diagnosis not present

## 2017-04-29 DIAGNOSIS — C771 Secondary and unspecified malignant neoplasm of intrathoracic lymph nodes: Secondary | ICD-10-CM | POA: Diagnosis not present

## 2017-04-29 DIAGNOSIS — R938 Abnormal findings on diagnostic imaging of other specified body structures: Secondary | ICD-10-CM | POA: Diagnosis not present

## 2017-04-29 DIAGNOSIS — C3490 Malignant neoplasm of unspecified part of unspecified bronchus or lung: Secondary | ICD-10-CM | POA: Diagnosis not present

## 2017-04-29 DIAGNOSIS — C3482 Malignant neoplasm of overlapping sites of left bronchus and lung: Secondary | ICD-10-CM | POA: Diagnosis not present

## 2017-04-29 DIAGNOSIS — J9811 Atelectasis: Secondary | ICD-10-CM | POA: Diagnosis not present

## 2017-04-29 DIAGNOSIS — R918 Other nonspecific abnormal finding of lung field: Secondary | ICD-10-CM | POA: Diagnosis not present

## 2017-04-29 DIAGNOSIS — R6889 Other general symptoms and signs: Secondary | ICD-10-CM | POA: Diagnosis not present

## 2017-04-29 DIAGNOSIS — N281 Cyst of kidney, acquired: Secondary | ICD-10-CM | POA: Diagnosis not present

## 2017-04-29 DIAGNOSIS — G43901 Migraine, unspecified, not intractable, with status migrainosus: Secondary | ICD-10-CM | POA: Diagnosis not present

## 2017-04-29 DIAGNOSIS — Z87891 Personal history of nicotine dependence: Secondary | ICD-10-CM | POA: Diagnosis not present

## 2017-04-29 DIAGNOSIS — R599 Enlarged lymph nodes, unspecified: Secondary | ICD-10-CM | POA: Diagnosis not present

## 2017-04-29 DIAGNOSIS — I10 Essential (primary) hypertension: Secondary | ICD-10-CM | POA: Diagnosis not present

## 2017-04-29 DIAGNOSIS — C3431 Malignant neoplasm of lower lobe, right bronchus or lung: Secondary | ICD-10-CM | POA: Diagnosis not present

## 2017-04-30 DIAGNOSIS — I10 Essential (primary) hypertension: Secondary | ICD-10-CM | POA: Diagnosis not present

## 2017-04-30 DIAGNOSIS — J9811 Atelectasis: Secondary | ICD-10-CM | POA: Diagnosis not present

## 2017-04-30 DIAGNOSIS — R51 Headache: Secondary | ICD-10-CM | POA: Diagnosis not present

## 2017-04-30 DIAGNOSIS — C3431 Malignant neoplasm of lower lobe, right bronchus or lung: Secondary | ICD-10-CM | POA: Diagnosis not present

## 2017-04-30 DIAGNOSIS — R6889 Other general symptoms and signs: Secondary | ICD-10-CM | POA: Diagnosis not present

## 2017-04-30 DIAGNOSIS — C3490 Malignant neoplasm of unspecified part of unspecified bronchus or lung: Secondary | ICD-10-CM | POA: Diagnosis not present

## 2017-05-03 DIAGNOSIS — C3431 Malignant neoplasm of lower lobe, right bronchus or lung: Secondary | ICD-10-CM | POA: Diagnosis not present

## 2017-05-03 DIAGNOSIS — H6981 Other specified disorders of Eustachian tube, right ear: Secondary | ICD-10-CM | POA: Diagnosis not present

## 2017-05-03 DIAGNOSIS — I10 Essential (primary) hypertension: Secondary | ICD-10-CM | POA: Diagnosis not present

## 2017-05-03 DIAGNOSIS — Z510 Encounter for antineoplastic radiation therapy: Secondary | ICD-10-CM | POA: Diagnosis not present

## 2017-05-04 DIAGNOSIS — C3431 Malignant neoplasm of lower lobe, right bronchus or lung: Secondary | ICD-10-CM | POA: Diagnosis not present

## 2017-05-04 DIAGNOSIS — Z510 Encounter for antineoplastic radiation therapy: Secondary | ICD-10-CM | POA: Diagnosis not present

## 2017-05-05 DIAGNOSIS — C3431 Malignant neoplasm of lower lobe, right bronchus or lung: Secondary | ICD-10-CM | POA: Diagnosis not present

## 2017-05-05 DIAGNOSIS — Z510 Encounter for antineoplastic radiation therapy: Secondary | ICD-10-CM | POA: Diagnosis not present

## 2017-05-06 DIAGNOSIS — Z5181 Encounter for therapeutic drug level monitoring: Secondary | ICD-10-CM | POA: Diagnosis not present

## 2017-05-06 DIAGNOSIS — C771 Secondary and unspecified malignant neoplasm of intrathoracic lymph nodes: Secondary | ICD-10-CM | POA: Diagnosis not present

## 2017-05-06 DIAGNOSIS — C3431 Malignant neoplasm of lower lobe, right bronchus or lung: Secondary | ICD-10-CM | POA: Diagnosis not present

## 2017-05-06 DIAGNOSIS — C3412 Malignant neoplasm of upper lobe, left bronchus or lung: Secondary | ICD-10-CM | POA: Diagnosis not present

## 2017-05-06 DIAGNOSIS — Z510 Encounter for antineoplastic radiation therapy: Secondary | ICD-10-CM | POA: Diagnosis not present

## 2017-05-06 DIAGNOSIS — Z7952 Long term (current) use of systemic steroids: Secondary | ICD-10-CM | POA: Diagnosis not present

## 2017-05-06 DIAGNOSIS — M316 Other giant cell arteritis: Secondary | ICD-10-CM | POA: Diagnosis not present

## 2017-05-07 DIAGNOSIS — C3431 Malignant neoplasm of lower lobe, right bronchus or lung: Secondary | ICD-10-CM | POA: Diagnosis not present

## 2017-05-07 DIAGNOSIS — Z510 Encounter for antineoplastic radiation therapy: Secondary | ICD-10-CM | POA: Diagnosis not present

## 2017-05-10 DIAGNOSIS — Z510 Encounter for antineoplastic radiation therapy: Secondary | ICD-10-CM | POA: Diagnosis not present

## 2017-05-10 DIAGNOSIS — C3431 Malignant neoplasm of lower lobe, right bronchus or lung: Secondary | ICD-10-CM | POA: Diagnosis not present

## 2017-05-11 DIAGNOSIS — Z510 Encounter for antineoplastic radiation therapy: Secondary | ICD-10-CM | POA: Diagnosis not present

## 2017-05-11 DIAGNOSIS — C3490 Malignant neoplasm of unspecified part of unspecified bronchus or lung: Secondary | ICD-10-CM | POA: Diagnosis not present

## 2017-05-11 DIAGNOSIS — C3431 Malignant neoplasm of lower lobe, right bronchus or lung: Secondary | ICD-10-CM | POA: Diagnosis not present

## 2017-05-12 DIAGNOSIS — C3431 Malignant neoplasm of lower lobe, right bronchus or lung: Secondary | ICD-10-CM | POA: Diagnosis not present

## 2017-05-12 DIAGNOSIS — Z510 Encounter for antineoplastic radiation therapy: Secondary | ICD-10-CM | POA: Diagnosis not present

## 2017-05-13 DIAGNOSIS — Z510 Encounter for antineoplastic radiation therapy: Secondary | ICD-10-CM | POA: Diagnosis not present

## 2017-05-13 DIAGNOSIS — C3431 Malignant neoplasm of lower lobe, right bronchus or lung: Secondary | ICD-10-CM | POA: Diagnosis not present

## 2017-05-14 DIAGNOSIS — Z510 Encounter for antineoplastic radiation therapy: Secondary | ICD-10-CM | POA: Diagnosis not present

## 2017-05-14 DIAGNOSIS — C3431 Malignant neoplasm of lower lobe, right bronchus or lung: Secondary | ICD-10-CM | POA: Diagnosis not present

## 2017-05-17 DIAGNOSIS — C771 Secondary and unspecified malignant neoplasm of intrathoracic lymph nodes: Secondary | ICD-10-CM | POA: Diagnosis not present

## 2017-05-17 DIAGNOSIS — C3490 Malignant neoplasm of unspecified part of unspecified bronchus or lung: Secondary | ICD-10-CM | POA: Diagnosis not present

## 2017-05-17 DIAGNOSIS — Z452 Encounter for adjustment and management of vascular access device: Secondary | ICD-10-CM | POA: Diagnosis not present

## 2017-05-18 DIAGNOSIS — Z510 Encounter for antineoplastic radiation therapy: Secondary | ICD-10-CM | POA: Diagnosis not present

## 2017-05-18 DIAGNOSIS — C3431 Malignant neoplasm of lower lobe, right bronchus or lung: Secondary | ICD-10-CM | POA: Diagnosis not present

## 2017-05-19 DIAGNOSIS — Z87891 Personal history of nicotine dependence: Secondary | ICD-10-CM | POA: Diagnosis not present

## 2017-05-19 DIAGNOSIS — Z510 Encounter for antineoplastic radiation therapy: Secondary | ICD-10-CM | POA: Diagnosis not present

## 2017-05-19 DIAGNOSIS — Z803 Family history of malignant neoplasm of breast: Secondary | ICD-10-CM | POA: Diagnosis not present

## 2017-05-19 DIAGNOSIS — C3412 Malignant neoplasm of upper lobe, left bronchus or lung: Secondary | ICD-10-CM | POA: Diagnosis not present

## 2017-05-19 DIAGNOSIS — C3431 Malignant neoplasm of lower lobe, right bronchus or lung: Secondary | ICD-10-CM | POA: Diagnosis not present

## 2017-05-19 DIAGNOSIS — C771 Secondary and unspecified malignant neoplasm of intrathoracic lymph nodes: Secondary | ICD-10-CM | POA: Diagnosis not present

## 2017-05-20 DIAGNOSIS — Z510 Encounter for antineoplastic radiation therapy: Secondary | ICD-10-CM | POA: Diagnosis not present

## 2017-05-20 DIAGNOSIS — C3431 Malignant neoplasm of lower lobe, right bronchus or lung: Secondary | ICD-10-CM | POA: Diagnosis not present

## 2017-05-20 DIAGNOSIS — Z8669 Personal history of other diseases of the nervous system and sense organs: Secondary | ICD-10-CM | POA: Diagnosis not present

## 2017-05-20 DIAGNOSIS — Z955 Presence of coronary angioplasty implant and graft: Secondary | ICD-10-CM | POA: Diagnosis not present

## 2017-05-20 DIAGNOSIS — C771 Secondary and unspecified malignant neoplasm of intrathoracic lymph nodes: Secondary | ICD-10-CM | POA: Diagnosis not present

## 2017-05-20 DIAGNOSIS — I10 Essential (primary) hypertension: Secondary | ICD-10-CM | POA: Diagnosis not present

## 2017-05-20 DIAGNOSIS — C3490 Malignant neoplasm of unspecified part of unspecified bronchus or lung: Secondary | ICD-10-CM | POA: Diagnosis not present

## 2017-05-20 DIAGNOSIS — I2510 Atherosclerotic heart disease of native coronary artery without angina pectoris: Secondary | ICD-10-CM | POA: Diagnosis not present

## 2017-05-20 DIAGNOSIS — E7800 Pure hypercholesterolemia, unspecified: Secondary | ICD-10-CM | POA: Diagnosis not present

## 2017-05-21 DIAGNOSIS — C3431 Malignant neoplasm of lower lobe, right bronchus or lung: Secondary | ICD-10-CM | POA: Diagnosis not present

## 2017-05-21 DIAGNOSIS — R432 Parageusia: Secondary | ICD-10-CM | POA: Diagnosis not present

## 2017-05-21 DIAGNOSIS — C3412 Malignant neoplasm of upper lobe, left bronchus or lung: Secondary | ICD-10-CM | POA: Diagnosis not present

## 2017-05-21 DIAGNOSIS — C771 Secondary and unspecified malignant neoplasm of intrathoracic lymph nodes: Secondary | ICD-10-CM | POA: Diagnosis not present

## 2017-05-21 DIAGNOSIS — D649 Anemia, unspecified: Secondary | ICD-10-CM | POA: Diagnosis not present

## 2017-05-21 DIAGNOSIS — Z510 Encounter for antineoplastic radiation therapy: Secondary | ICD-10-CM | POA: Diagnosis not present

## 2017-05-21 DIAGNOSIS — Z7952 Long term (current) use of systemic steroids: Secondary | ICD-10-CM | POA: Diagnosis not present

## 2017-05-24 DIAGNOSIS — C3431 Malignant neoplasm of lower lobe, right bronchus or lung: Secondary | ICD-10-CM | POA: Diagnosis not present

## 2017-05-24 DIAGNOSIS — Z510 Encounter for antineoplastic radiation therapy: Secondary | ICD-10-CM | POA: Diagnosis not present

## 2017-05-27 DIAGNOSIS — Z510 Encounter for antineoplastic radiation therapy: Secondary | ICD-10-CM | POA: Diagnosis not present

## 2017-05-27 DIAGNOSIS — C3431 Malignant neoplasm of lower lobe, right bronchus or lung: Secondary | ICD-10-CM | POA: Diagnosis not present

## 2017-05-28 ENCOUNTER — Other Ambulatory Visit: Payer: Self-pay | Admitting: Family Medicine

## 2017-05-28 DIAGNOSIS — Z510 Encounter for antineoplastic radiation therapy: Secondary | ICD-10-CM | POA: Diagnosis not present

## 2017-05-28 DIAGNOSIS — C3431 Malignant neoplasm of lower lobe, right bronchus or lung: Secondary | ICD-10-CM | POA: Diagnosis not present

## 2017-05-28 DIAGNOSIS — C3490 Malignant neoplasm of unspecified part of unspecified bronchus or lung: Secondary | ICD-10-CM | POA: Diagnosis not present

## 2017-05-28 DIAGNOSIS — D509 Iron deficiency anemia, unspecified: Secondary | ICD-10-CM | POA: Diagnosis not present

## 2017-05-28 DIAGNOSIS — K208 Other esophagitis: Secondary | ICD-10-CM | POA: Diagnosis not present

## 2017-05-31 DIAGNOSIS — C3431 Malignant neoplasm of lower lobe, right bronchus or lung: Secondary | ICD-10-CM | POA: Diagnosis not present

## 2017-05-31 DIAGNOSIS — Z510 Encounter for antineoplastic radiation therapy: Secondary | ICD-10-CM | POA: Diagnosis not present

## 2017-06-01 DIAGNOSIS — Z510 Encounter for antineoplastic radiation therapy: Secondary | ICD-10-CM | POA: Diagnosis not present

## 2017-06-01 DIAGNOSIS — C3431 Malignant neoplasm of lower lobe, right bronchus or lung: Secondary | ICD-10-CM | POA: Diagnosis not present

## 2017-06-02 DIAGNOSIS — Z510 Encounter for antineoplastic radiation therapy: Secondary | ICD-10-CM | POA: Diagnosis not present

## 2017-06-02 DIAGNOSIS — C3431 Malignant neoplasm of lower lobe, right bronchus or lung: Secondary | ICD-10-CM | POA: Diagnosis not present

## 2017-06-03 DIAGNOSIS — D509 Iron deficiency anemia, unspecified: Secondary | ICD-10-CM | POA: Diagnosis not present

## 2017-06-03 DIAGNOSIS — Z510 Encounter for antineoplastic radiation therapy: Secondary | ICD-10-CM | POA: Diagnosis not present

## 2017-06-03 DIAGNOSIS — C3490 Malignant neoplasm of unspecified part of unspecified bronchus or lung: Secondary | ICD-10-CM | POA: Diagnosis not present

## 2017-06-03 DIAGNOSIS — C3431 Malignant neoplasm of lower lobe, right bronchus or lung: Secondary | ICD-10-CM | POA: Diagnosis not present

## 2017-06-03 DIAGNOSIS — E440 Moderate protein-calorie malnutrition: Secondary | ICD-10-CM | POA: Diagnosis not present

## 2017-06-03 DIAGNOSIS — E860 Dehydration: Secondary | ICD-10-CM | POA: Diagnosis not present

## 2017-06-03 DIAGNOSIS — K208 Other esophagitis: Secondary | ICD-10-CM | POA: Diagnosis not present

## 2017-06-04 DIAGNOSIS — C3431 Malignant neoplasm of lower lobe, right bronchus or lung: Secondary | ICD-10-CM | POA: Diagnosis not present

## 2017-06-04 DIAGNOSIS — Z510 Encounter for antineoplastic radiation therapy: Secondary | ICD-10-CM | POA: Diagnosis not present

## 2017-06-07 DIAGNOSIS — C3431 Malignant neoplasm of lower lobe, right bronchus or lung: Secondary | ICD-10-CM | POA: Diagnosis not present

## 2017-06-07 DIAGNOSIS — Z510 Encounter for antineoplastic radiation therapy: Secondary | ICD-10-CM | POA: Diagnosis not present

## 2017-06-08 DIAGNOSIS — C3431 Malignant neoplasm of lower lobe, right bronchus or lung: Secondary | ICD-10-CM | POA: Diagnosis not present

## 2017-06-08 DIAGNOSIS — Z510 Encounter for antineoplastic radiation therapy: Secondary | ICD-10-CM | POA: Diagnosis not present

## 2017-06-09 DIAGNOSIS — J156 Pneumonia due to other aerobic Gram-negative bacteria: Secondary | ICD-10-CM | POA: Diagnosis not present

## 2017-06-09 DIAGNOSIS — C771 Secondary and unspecified malignant neoplasm of intrathoracic lymph nodes: Secondary | ICD-10-CM | POA: Diagnosis not present

## 2017-06-09 DIAGNOSIS — Z66 Do not resuscitate: Secondary | ICD-10-CM | POA: Diagnosis not present

## 2017-06-09 DIAGNOSIS — C3490 Malignant neoplasm of unspecified part of unspecified bronchus or lung: Secondary | ICD-10-CM | POA: Diagnosis not present

## 2017-06-09 DIAGNOSIS — E785 Hyperlipidemia, unspecified: Secondary | ICD-10-CM | POA: Diagnosis not present

## 2017-06-09 DIAGNOSIS — A419 Sepsis, unspecified organism: Secondary | ICD-10-CM | POA: Diagnosis not present

## 2017-06-09 DIAGNOSIS — K922 Gastrointestinal hemorrhage, unspecified: Secondary | ICD-10-CM | POA: Diagnosis not present

## 2017-06-09 DIAGNOSIS — Z79899 Other long term (current) drug therapy: Secondary | ICD-10-CM | POA: Diagnosis not present

## 2017-06-09 DIAGNOSIS — J90 Pleural effusion, not elsewhere classified: Secondary | ICD-10-CM | POA: Diagnosis not present

## 2017-06-09 DIAGNOSIS — R079 Chest pain, unspecified: Secondary | ICD-10-CM | POA: Diagnosis not present

## 2017-06-09 DIAGNOSIS — R918 Other nonspecific abnormal finding of lung field: Secondary | ICD-10-CM | POA: Diagnosis not present

## 2017-06-09 DIAGNOSIS — E43 Unspecified severe protein-calorie malnutrition: Secondary | ICD-10-CM | POA: Diagnosis not present

## 2017-06-09 DIAGNOSIS — R5081 Fever presenting with conditions classified elsewhere: Secondary | ICD-10-CM | POA: Diagnosis not present

## 2017-06-09 DIAGNOSIS — J189 Pneumonia, unspecified organism: Secondary | ICD-10-CM | POA: Diagnosis not present

## 2017-06-09 DIAGNOSIS — R0602 Shortness of breath: Secondary | ICD-10-CM | POA: Diagnosis not present

## 2017-06-09 DIAGNOSIS — R6520 Severe sepsis without septic shock: Secondary | ICD-10-CM | POA: Diagnosis not present

## 2017-06-09 DIAGNOSIS — B379 Candidiasis, unspecified: Secondary | ICD-10-CM | POA: Diagnosis not present

## 2017-06-09 DIAGNOSIS — K208 Other esophagitis: Secondary | ICD-10-CM | POA: Diagnosis not present

## 2017-06-09 DIAGNOSIS — E860 Dehydration: Secondary | ICD-10-CM | POA: Diagnosis not present

## 2017-06-09 DIAGNOSIS — D509 Iron deficiency anemia, unspecified: Secondary | ICD-10-CM | POA: Diagnosis not present

## 2017-06-09 DIAGNOSIS — Z5111 Encounter for antineoplastic chemotherapy: Secondary | ICD-10-CM | POA: Diagnosis not present

## 2017-06-09 DIAGNOSIS — I959 Hypotension, unspecified: Secondary | ICD-10-CM | POA: Diagnosis not present

## 2017-06-09 DIAGNOSIS — D649 Anemia, unspecified: Secondary | ICD-10-CM | POA: Diagnosis not present

## 2017-06-09 DIAGNOSIS — I10 Essential (primary) hypertension: Secondary | ICD-10-CM | POA: Diagnosis not present

## 2017-06-09 DIAGNOSIS — J181 Lobar pneumonia, unspecified organism: Secondary | ICD-10-CM | POA: Diagnosis not present

## 2017-06-09 DIAGNOSIS — D709 Neutropenia, unspecified: Secondary | ICD-10-CM | POA: Diagnosis not present

## 2017-06-09 DIAGNOSIS — E876 Hypokalemia: Secondary | ICD-10-CM | POA: Diagnosis not present

## 2017-06-09 DIAGNOSIS — C799 Secondary malignant neoplasm of unspecified site: Secondary | ICD-10-CM | POA: Diagnosis not present

## 2017-06-09 DIAGNOSIS — Z7982 Long term (current) use of aspirin: Secondary | ICD-10-CM | POA: Diagnosis not present

## 2017-06-10 DIAGNOSIS — D509 Iron deficiency anemia, unspecified: Secondary | ICD-10-CM | POA: Diagnosis not present

## 2017-06-10 DIAGNOSIS — C3490 Malignant neoplasm of unspecified part of unspecified bronchus or lung: Secondary | ICD-10-CM | POA: Diagnosis not present

## 2017-06-10 DIAGNOSIS — B379 Candidiasis, unspecified: Secondary | ICD-10-CM | POA: Diagnosis not present

## 2017-06-10 DIAGNOSIS — C771 Secondary and unspecified malignant neoplasm of intrathoracic lymph nodes: Secondary | ICD-10-CM | POA: Diagnosis not present

## 2017-06-10 DIAGNOSIS — I959 Hypotension, unspecified: Secondary | ICD-10-CM | POA: Diagnosis not present

## 2017-06-10 DIAGNOSIS — K208 Other esophagitis: Secondary | ICD-10-CM | POA: Diagnosis not present

## 2017-06-10 DIAGNOSIS — Z5111 Encounter for antineoplastic chemotherapy: Secondary | ICD-10-CM | POA: Diagnosis not present

## 2017-06-15 DIAGNOSIS — Z510 Encounter for antineoplastic radiation therapy: Secondary | ICD-10-CM | POA: Diagnosis not present

## 2017-06-15 DIAGNOSIS — C3431 Malignant neoplasm of lower lobe, right bronchus or lung: Secondary | ICD-10-CM | POA: Diagnosis not present

## 2017-06-17 DIAGNOSIS — I959 Hypotension, unspecified: Secondary | ICD-10-CM | POA: Diagnosis not present

## 2017-06-17 DIAGNOSIS — E440 Moderate protein-calorie malnutrition: Secondary | ICD-10-CM | POA: Diagnosis not present

## 2017-06-17 DIAGNOSIS — E860 Dehydration: Secondary | ICD-10-CM | POA: Diagnosis not present

## 2017-06-17 DIAGNOSIS — D509 Iron deficiency anemia, unspecified: Secondary | ICD-10-CM | POA: Diagnosis not present

## 2017-06-17 DIAGNOSIS — C3490 Malignant neoplasm of unspecified part of unspecified bronchus or lung: Secondary | ICD-10-CM | POA: Diagnosis not present

## 2017-06-17 DIAGNOSIS — K208 Other esophagitis: Secondary | ICD-10-CM | POA: Diagnosis not present

## 2017-06-17 DIAGNOSIS — B379 Candidiasis, unspecified: Secondary | ICD-10-CM | POA: Diagnosis not present

## 2017-06-17 DIAGNOSIS — C771 Secondary and unspecified malignant neoplasm of intrathoracic lymph nodes: Secondary | ICD-10-CM | POA: Diagnosis not present

## 2017-06-17 DIAGNOSIS — Z5111 Encounter for antineoplastic chemotherapy: Secondary | ICD-10-CM | POA: Diagnosis not present

## 2017-06-21 DIAGNOSIS — I10 Essential (primary) hypertension: Secondary | ICD-10-CM | POA: Diagnosis not present

## 2017-06-21 DIAGNOSIS — Z09 Encounter for follow-up examination after completed treatment for conditions other than malignant neoplasm: Secondary | ICD-10-CM | POA: Diagnosis not present

## 2017-06-21 DIAGNOSIS — C3490 Malignant neoplasm of unspecified part of unspecified bronchus or lung: Secondary | ICD-10-CM | POA: Diagnosis not present

## 2017-06-21 DIAGNOSIS — I2510 Atherosclerotic heart disease of native coronary artery without angina pectoris: Secondary | ICD-10-CM | POA: Diagnosis not present

## 2017-06-22 DIAGNOSIS — D509 Iron deficiency anemia, unspecified: Secondary | ICD-10-CM | POA: Diagnosis not present

## 2017-06-22 DIAGNOSIS — C3490 Malignant neoplasm of unspecified part of unspecified bronchus or lung: Secondary | ICD-10-CM | POA: Diagnosis not present

## 2017-06-22 DIAGNOSIS — B379 Candidiasis, unspecified: Secondary | ICD-10-CM | POA: Diagnosis not present

## 2017-06-22 DIAGNOSIS — C771 Secondary and unspecified malignant neoplasm of intrathoracic lymph nodes: Secondary | ICD-10-CM | POA: Diagnosis not present

## 2017-06-22 DIAGNOSIS — I959 Hypotension, unspecified: Secondary | ICD-10-CM | POA: Diagnosis not present

## 2017-06-22 DIAGNOSIS — Z5111 Encounter for antineoplastic chemotherapy: Secondary | ICD-10-CM | POA: Diagnosis not present

## 2017-06-24 DIAGNOSIS — R2689 Other abnormalities of gait and mobility: Secondary | ICD-10-CM | POA: Diagnosis not present

## 2017-06-24 DIAGNOSIS — R0902 Hypoxemia: Secondary | ICD-10-CM | POA: Diagnosis not present

## 2017-06-24 DIAGNOSIS — R509 Fever, unspecified: Secondary | ICD-10-CM | POA: Diagnosis not present

## 2017-06-24 DIAGNOSIS — R6520 Severe sepsis without septic shock: Secondary | ICD-10-CM | POA: Diagnosis not present

## 2017-06-24 DIAGNOSIS — R05 Cough: Secondary | ICD-10-CM | POA: Diagnosis not present

## 2017-06-24 DIAGNOSIS — G629 Polyneuropathy, unspecified: Secondary | ICD-10-CM | POA: Diagnosis not present

## 2017-06-24 DIAGNOSIS — J9811 Atelectasis: Secondary | ICD-10-CM | POA: Diagnosis not present

## 2017-06-24 DIAGNOSIS — R0602 Shortness of breath: Secondary | ICD-10-CM | POA: Diagnosis not present

## 2017-06-24 DIAGNOSIS — A419 Sepsis, unspecified organism: Secondary | ICD-10-CM | POA: Diagnosis not present

## 2017-06-24 DIAGNOSIS — I6782 Cerebral ischemia: Secondary | ICD-10-CM | POA: Diagnosis not present

## 2017-06-24 DIAGNOSIS — D709 Neutropenia, unspecified: Secondary | ICD-10-CM | POA: Diagnosis not present

## 2017-06-24 DIAGNOSIS — J168 Pneumonia due to other specified infectious organisms: Secondary | ICD-10-CM | POA: Diagnosis not present

## 2017-06-24 DIAGNOSIS — M6281 Muscle weakness (generalized): Secondary | ICD-10-CM | POA: Diagnosis not present

## 2017-06-24 DIAGNOSIS — J181 Lobar pneumonia, unspecified organism: Secondary | ICD-10-CM | POA: Diagnosis not present

## 2017-06-24 DIAGNOSIS — I10 Essential (primary) hypertension: Secondary | ICD-10-CM | POA: Diagnosis not present

## 2017-06-24 DIAGNOSIS — J9809 Other diseases of bronchus, not elsewhere classified: Secondary | ICD-10-CM | POA: Diagnosis not present

## 2017-06-24 DIAGNOSIS — D849 Immunodeficiency, unspecified: Secondary | ICD-10-CM | POA: Diagnosis not present

## 2017-06-24 DIAGNOSIS — E785 Hyperlipidemia, unspecified: Secondary | ICD-10-CM | POA: Diagnosis not present

## 2017-06-24 DIAGNOSIS — D6489 Other specified anemias: Secondary | ICD-10-CM | POA: Diagnosis not present

## 2017-06-24 DIAGNOSIS — E876 Hypokalemia: Secondary | ICD-10-CM | POA: Diagnosis not present

## 2017-06-24 DIAGNOSIS — Z87891 Personal history of nicotine dependence: Secondary | ICD-10-CM | POA: Diagnosis not present

## 2017-06-24 DIAGNOSIS — I2510 Atherosclerotic heart disease of native coronary artery without angina pectoris: Secondary | ICD-10-CM | POA: Diagnosis not present

## 2017-06-24 DIAGNOSIS — R278 Other lack of coordination: Secondary | ICD-10-CM | POA: Diagnosis not present

## 2017-06-24 DIAGNOSIS — G8929 Other chronic pain: Secondary | ICD-10-CM | POA: Diagnosis not present

## 2017-06-24 DIAGNOSIS — C771 Secondary and unspecified malignant neoplasm of intrathoracic lymph nodes: Secondary | ICD-10-CM | POA: Diagnosis not present

## 2017-06-24 DIAGNOSIS — N390 Urinary tract infection, site not specified: Secondary | ICD-10-CM | POA: Diagnosis not present

## 2017-06-24 DIAGNOSIS — Z043 Encounter for examination and observation following other accident: Secondary | ICD-10-CM | POA: Diagnosis not present

## 2017-06-24 DIAGNOSIS — D509 Iron deficiency anemia, unspecified: Secondary | ICD-10-CM | POA: Diagnosis not present

## 2017-06-24 DIAGNOSIS — G319 Degenerative disease of nervous system, unspecified: Secondary | ICD-10-CM | POA: Diagnosis not present

## 2017-06-24 DIAGNOSIS — Z7982 Long term (current) use of aspirin: Secondary | ICD-10-CM | POA: Diagnosis not present

## 2017-06-24 DIAGNOSIS — Z8669 Personal history of other diseases of the nervous system and sense organs: Secondary | ICD-10-CM | POA: Diagnosis not present

## 2017-06-24 DIAGNOSIS — Z888 Allergy status to other drugs, medicaments and biological substances status: Secondary | ICD-10-CM | POA: Diagnosis not present

## 2017-06-24 DIAGNOSIS — D649 Anemia, unspecified: Secondary | ICD-10-CM | POA: Diagnosis not present

## 2017-06-24 DIAGNOSIS — C3490 Malignant neoplasm of unspecified part of unspecified bronchus or lung: Secondary | ICD-10-CM | POA: Diagnosis not present

## 2017-06-24 DIAGNOSIS — D630 Anemia in neoplastic disease: Secondary | ICD-10-CM | POA: Diagnosis not present

## 2017-06-24 DIAGNOSIS — E860 Dehydration: Secondary | ICD-10-CM | POA: Diagnosis not present

## 2017-06-24 DIAGNOSIS — B0233 Zoster keratitis: Secondary | ICD-10-CM | POA: Diagnosis not present

## 2017-06-24 DIAGNOSIS — J9601 Acute respiratory failure with hypoxia: Secondary | ICD-10-CM | POA: Diagnosis not present

## 2017-06-24 DIAGNOSIS — R918 Other nonspecific abnormal finding of lung field: Secondary | ICD-10-CM | POA: Diagnosis not present

## 2017-06-24 DIAGNOSIS — Z79899 Other long term (current) drug therapy: Secondary | ICD-10-CM | POA: Diagnosis not present

## 2017-06-24 DIAGNOSIS — J398 Other specified diseases of upper respiratory tract: Secondary | ICD-10-CM | POA: Diagnosis not present

## 2017-06-24 DIAGNOSIS — Z955 Presence of coronary angioplasty implant and graft: Secondary | ICD-10-CM | POA: Diagnosis not present

## 2017-06-24 DIAGNOSIS — G500 Trigeminal neuralgia: Secondary | ICD-10-CM | POA: Diagnosis not present

## 2017-06-24 DIAGNOSIS — J984 Other disorders of lung: Secondary | ICD-10-CM | POA: Diagnosis not present

## 2017-06-24 DIAGNOSIS — J9819 Other pulmonary collapse: Secondary | ICD-10-CM | POA: Diagnosis not present

## 2017-06-24 DIAGNOSIS — K219 Gastro-esophageal reflux disease without esophagitis: Secondary | ICD-10-CM | POA: Diagnosis not present

## 2017-06-24 DIAGNOSIS — J189 Pneumonia, unspecified organism: Secondary | ICD-10-CM | POA: Diagnosis not present

## 2017-07-01 DIAGNOSIS — Z888 Allergy status to other drugs, medicaments and biological substances status: Secondary | ICD-10-CM | POA: Diagnosis not present

## 2017-07-01 DIAGNOSIS — J9601 Acute respiratory failure with hypoxia: Secondary | ICD-10-CM | POA: Diagnosis not present

## 2017-07-01 DIAGNOSIS — E785 Hyperlipidemia, unspecified: Secondary | ICD-10-CM | POA: Diagnosis not present

## 2017-07-01 DIAGNOSIS — R918 Other nonspecific abnormal finding of lung field: Secondary | ICD-10-CM | POA: Diagnosis not present

## 2017-07-01 DIAGNOSIS — I10 Essential (primary) hypertension: Secondary | ICD-10-CM | POA: Diagnosis not present

## 2017-07-01 DIAGNOSIS — B0233 Zoster keratitis: Secondary | ICD-10-CM | POA: Diagnosis not present

## 2017-07-01 DIAGNOSIS — G500 Trigeminal neuralgia: Secondary | ICD-10-CM | POA: Diagnosis not present

## 2017-07-01 DIAGNOSIS — D630 Anemia in neoplastic disease: Secondary | ICD-10-CM | POA: Diagnosis not present

## 2017-07-01 DIAGNOSIS — J9809 Other diseases of bronchus, not elsewhere classified: Secondary | ICD-10-CM | POA: Diagnosis not present

## 2017-07-01 DIAGNOSIS — R6520 Severe sepsis without septic shock: Secondary | ICD-10-CM | POA: Diagnosis not present

## 2017-07-01 DIAGNOSIS — Z955 Presence of coronary angioplasty implant and graft: Secondary | ICD-10-CM | POA: Diagnosis not present

## 2017-07-01 DIAGNOSIS — Z87891 Personal history of nicotine dependence: Secondary | ICD-10-CM | POA: Diagnosis not present

## 2017-07-01 DIAGNOSIS — R0602 Shortness of breath: Secondary | ICD-10-CM | POA: Diagnosis not present

## 2017-07-01 DIAGNOSIS — G8929 Other chronic pain: Secondary | ICD-10-CM | POA: Diagnosis not present

## 2017-07-01 DIAGNOSIS — J398 Other specified diseases of upper respiratory tract: Secondary | ICD-10-CM | POA: Diagnosis not present

## 2017-07-01 DIAGNOSIS — Z7982 Long term (current) use of aspirin: Secondary | ICD-10-CM | POA: Diagnosis not present

## 2017-07-01 DIAGNOSIS — K219 Gastro-esophageal reflux disease without esophagitis: Secondary | ICD-10-CM | POA: Diagnosis not present

## 2017-07-01 DIAGNOSIS — R509 Fever, unspecified: Secondary | ICD-10-CM | POA: Diagnosis not present

## 2017-07-01 DIAGNOSIS — D509 Iron deficiency anemia, unspecified: Secondary | ICD-10-CM | POA: Diagnosis not present

## 2017-07-01 DIAGNOSIS — A419 Sepsis, unspecified organism: Secondary | ICD-10-CM | POA: Diagnosis not present

## 2017-07-01 DIAGNOSIS — C771 Secondary and unspecified malignant neoplasm of intrathoracic lymph nodes: Secondary | ICD-10-CM | POA: Diagnosis not present

## 2017-07-01 DIAGNOSIS — J189 Pneumonia, unspecified organism: Secondary | ICD-10-CM | POA: Diagnosis not present

## 2017-07-01 DIAGNOSIS — J9819 Other pulmonary collapse: Secondary | ICD-10-CM | POA: Diagnosis not present

## 2017-07-01 DIAGNOSIS — Z79899 Other long term (current) drug therapy: Secondary | ICD-10-CM | POA: Diagnosis not present

## 2017-07-01 DIAGNOSIS — D709 Neutropenia, unspecified: Secondary | ICD-10-CM | POA: Diagnosis not present

## 2017-07-01 DIAGNOSIS — C3490 Malignant neoplasm of unspecified part of unspecified bronchus or lung: Secondary | ICD-10-CM | POA: Diagnosis not present

## 2017-07-01 DIAGNOSIS — I2510 Atherosclerotic heart disease of native coronary artery without angina pectoris: Secondary | ICD-10-CM | POA: Diagnosis not present

## 2017-07-07 DIAGNOSIS — M6281 Muscle weakness (generalized): Secondary | ICD-10-CM | POA: Diagnosis not present

## 2017-07-07 DIAGNOSIS — J181 Lobar pneumonia, unspecified organism: Secondary | ICD-10-CM | POA: Diagnosis not present

## 2017-07-07 DIAGNOSIS — R278 Other lack of coordination: Secondary | ICD-10-CM | POA: Diagnosis not present

## 2017-07-07 DIAGNOSIS — R509 Fever, unspecified: Secondary | ICD-10-CM | POA: Diagnosis not present

## 2017-07-07 DIAGNOSIS — D6489 Other specified anemias: Secondary | ICD-10-CM | POA: Diagnosis not present

## 2017-07-07 DIAGNOSIS — C3490 Malignant neoplasm of unspecified part of unspecified bronchus or lung: Secondary | ICD-10-CM | POA: Diagnosis not present

## 2017-07-07 DIAGNOSIS — G629 Polyneuropathy, unspecified: Secondary | ICD-10-CM | POA: Diagnosis not present

## 2017-07-07 DIAGNOSIS — R5381 Other malaise: Secondary | ICD-10-CM | POA: Diagnosis not present

## 2017-07-07 DIAGNOSIS — D849 Immunodeficiency, unspecified: Secondary | ICD-10-CM | POA: Diagnosis not present

## 2017-07-07 DIAGNOSIS — J398 Other specified diseases of upper respiratory tract: Secondary | ICD-10-CM | POA: Diagnosis not present

## 2017-07-07 DIAGNOSIS — G8929 Other chronic pain: Secondary | ICD-10-CM | POA: Diagnosis not present

## 2017-07-07 DIAGNOSIS — R2689 Other abnormalities of gait and mobility: Secondary | ICD-10-CM | POA: Diagnosis not present

## 2017-07-07 DIAGNOSIS — I10 Essential (primary) hypertension: Secondary | ICD-10-CM | POA: Diagnosis not present

## 2017-07-12 DIAGNOSIS — R5381 Other malaise: Secondary | ICD-10-CM | POA: Diagnosis not present

## 2017-07-16 ENCOUNTER — Other Ambulatory Visit: Payer: Self-pay | Admitting: Family Medicine

## 2017-07-16 NOTE — Telephone Encounter (Signed)
Patient is in need of an appt/thx dmf

## 2017-07-18 DIAGNOSIS — K219 Gastro-esophageal reflux disease without esophagitis: Secondary | ICD-10-CM | POA: Diagnosis not present

## 2017-07-18 DIAGNOSIS — Z7982 Long term (current) use of aspirin: Secondary | ICD-10-CM | POA: Diagnosis not present

## 2017-07-18 DIAGNOSIS — Z79891 Long term (current) use of opiate analgesic: Secondary | ICD-10-CM | POA: Diagnosis not present

## 2017-07-18 DIAGNOSIS — G500 Trigeminal neuralgia: Secondary | ICD-10-CM | POA: Diagnosis not present

## 2017-07-18 DIAGNOSIS — I10 Essential (primary) hypertension: Secondary | ICD-10-CM | POA: Diagnosis not present

## 2017-07-18 DIAGNOSIS — Z9981 Dependence on supplemental oxygen: Secondary | ICD-10-CM | POA: Diagnosis not present

## 2017-07-18 DIAGNOSIS — I2510 Atherosclerotic heart disease of native coronary artery without angina pectoris: Secondary | ICD-10-CM | POA: Diagnosis not present

## 2017-07-18 DIAGNOSIS — C3492 Malignant neoplasm of unspecified part of left bronchus or lung: Secondary | ICD-10-CM | POA: Diagnosis not present

## 2017-07-18 DIAGNOSIS — Y842 Radiological procedure and radiotherapy as the cause of abnormal reaction of the patient, or of later complication, without mention of misadventure at the time of the procedure: Secondary | ICD-10-CM | POA: Diagnosis not present

## 2017-07-18 DIAGNOSIS — B0233 Zoster keratitis: Secondary | ICD-10-CM | POA: Diagnosis not present

## 2017-07-18 DIAGNOSIS — K208 Other esophagitis: Secondary | ICD-10-CM | POA: Diagnosis not present

## 2017-07-18 DIAGNOSIS — C771 Secondary and unspecified malignant neoplasm of intrathoracic lymph nodes: Secondary | ICD-10-CM | POA: Diagnosis not present

## 2017-07-20 DIAGNOSIS — Z09 Encounter for follow-up examination after completed treatment for conditions other than malignant neoplasm: Secondary | ICD-10-CM | POA: Diagnosis not present

## 2017-07-20 DIAGNOSIS — I10 Essential (primary) hypertension: Secondary | ICD-10-CM | POA: Diagnosis not present

## 2017-07-20 DIAGNOSIS — E860 Dehydration: Secondary | ICD-10-CM | POA: Diagnosis not present

## 2017-07-20 DIAGNOSIS — I2510 Atherosclerotic heart disease of native coronary artery without angina pectoris: Secondary | ICD-10-CM | POA: Diagnosis not present

## 2017-07-21 DIAGNOSIS — G500 Trigeminal neuralgia: Secondary | ICD-10-CM | POA: Diagnosis not present

## 2017-07-21 DIAGNOSIS — Z79891 Long term (current) use of opiate analgesic: Secondary | ICD-10-CM | POA: Diagnosis not present

## 2017-07-21 DIAGNOSIS — I10 Essential (primary) hypertension: Secondary | ICD-10-CM | POA: Diagnosis not present

## 2017-07-21 DIAGNOSIS — Y842 Radiological procedure and radiotherapy as the cause of abnormal reaction of the patient, or of later complication, without mention of misadventure at the time of the procedure: Secondary | ICD-10-CM | POA: Diagnosis not present

## 2017-07-21 DIAGNOSIS — K219 Gastro-esophageal reflux disease without esophagitis: Secondary | ICD-10-CM | POA: Diagnosis not present

## 2017-07-21 DIAGNOSIS — I2510 Atherosclerotic heart disease of native coronary artery without angina pectoris: Secondary | ICD-10-CM | POA: Diagnosis not present

## 2017-07-21 DIAGNOSIS — Z7982 Long term (current) use of aspirin: Secondary | ICD-10-CM | POA: Diagnosis not present

## 2017-07-21 DIAGNOSIS — Z9981 Dependence on supplemental oxygen: Secondary | ICD-10-CM | POA: Diagnosis not present

## 2017-07-21 DIAGNOSIS — B0233 Zoster keratitis: Secondary | ICD-10-CM | POA: Diagnosis not present

## 2017-07-21 DIAGNOSIS — C771 Secondary and unspecified malignant neoplasm of intrathoracic lymph nodes: Secondary | ICD-10-CM | POA: Diagnosis not present

## 2017-07-21 DIAGNOSIS — C3492 Malignant neoplasm of unspecified part of left bronchus or lung: Secondary | ICD-10-CM | POA: Diagnosis not present

## 2017-07-21 DIAGNOSIS — K208 Other esophagitis: Secondary | ICD-10-CM | POA: Diagnosis not present

## 2017-07-22 DIAGNOSIS — C771 Secondary and unspecified malignant neoplasm of intrathoracic lymph nodes: Secondary | ICD-10-CM | POA: Diagnosis not present

## 2017-07-22 DIAGNOSIS — B0233 Zoster keratitis: Secondary | ICD-10-CM | POA: Diagnosis not present

## 2017-07-22 DIAGNOSIS — Z9981 Dependence on supplemental oxygen: Secondary | ICD-10-CM | POA: Diagnosis not present

## 2017-07-22 DIAGNOSIS — I2510 Atherosclerotic heart disease of native coronary artery without angina pectoris: Secondary | ICD-10-CM | POA: Diagnosis not present

## 2017-07-22 DIAGNOSIS — K208 Other esophagitis: Secondary | ICD-10-CM | POA: Diagnosis not present

## 2017-07-22 DIAGNOSIS — K219 Gastro-esophageal reflux disease without esophagitis: Secondary | ICD-10-CM | POA: Diagnosis not present

## 2017-07-22 DIAGNOSIS — C3492 Malignant neoplasm of unspecified part of left bronchus or lung: Secondary | ICD-10-CM | POA: Diagnosis not present

## 2017-07-22 DIAGNOSIS — I10 Essential (primary) hypertension: Secondary | ICD-10-CM | POA: Diagnosis not present

## 2017-07-22 DIAGNOSIS — G500 Trigeminal neuralgia: Secondary | ICD-10-CM | POA: Diagnosis not present

## 2017-07-22 DIAGNOSIS — Y842 Radiological procedure and radiotherapy as the cause of abnormal reaction of the patient, or of later complication, without mention of misadventure at the time of the procedure: Secondary | ICD-10-CM | POA: Diagnosis not present

## 2017-07-22 DIAGNOSIS — Z7982 Long term (current) use of aspirin: Secondary | ICD-10-CM | POA: Diagnosis not present

## 2017-07-22 DIAGNOSIS — Z79891 Long term (current) use of opiate analgesic: Secondary | ICD-10-CM | POA: Diagnosis not present

## 2017-07-23 DIAGNOSIS — E46 Unspecified protein-calorie malnutrition: Secondary | ICD-10-CM | POA: Diagnosis not present

## 2017-07-23 DIAGNOSIS — J189 Pneumonia, unspecified organism: Secondary | ICD-10-CM | POA: Diagnosis not present

## 2017-07-23 DIAGNOSIS — M7989 Other specified soft tissue disorders: Secondary | ICD-10-CM | POA: Diagnosis not present

## 2017-07-23 DIAGNOSIS — I9589 Other hypotension: Secondary | ICD-10-CM | POA: Diagnosis not present

## 2017-07-23 DIAGNOSIS — C3490 Malignant neoplasm of unspecified part of unspecified bronchus or lung: Secondary | ICD-10-CM | POA: Diagnosis not present

## 2017-07-23 DIAGNOSIS — C771 Secondary and unspecified malignant neoplasm of intrathoracic lymph nodes: Secondary | ICD-10-CM | POA: Diagnosis not present

## 2017-07-23 DIAGNOSIS — I82443 Acute embolism and thrombosis of tibial vein, bilateral: Secondary | ICD-10-CM | POA: Diagnosis not present

## 2017-07-23 DIAGNOSIS — C3482 Malignant neoplasm of overlapping sites of left bronchus and lung: Secondary | ICD-10-CM | POA: Diagnosis not present

## 2017-07-23 DIAGNOSIS — I82431 Acute embolism and thrombosis of right popliteal vein: Secondary | ICD-10-CM | POA: Diagnosis not present

## 2017-07-23 DIAGNOSIS — R600 Localized edema: Secondary | ICD-10-CM | POA: Diagnosis not present

## 2017-07-27 DIAGNOSIS — D509 Iron deficiency anemia, unspecified: Secondary | ICD-10-CM | POA: Diagnosis not present

## 2017-07-27 DIAGNOSIS — R531 Weakness: Secondary | ICD-10-CM | POA: Diagnosis not present

## 2017-07-27 DIAGNOSIS — E860 Dehydration: Secondary | ICD-10-CM | POA: Diagnosis not present

## 2017-07-27 DIAGNOSIS — Z7901 Long term (current) use of anticoagulants: Secondary | ICD-10-CM | POA: Diagnosis not present

## 2017-07-27 DIAGNOSIS — C771 Secondary and unspecified malignant neoplasm of intrathoracic lymph nodes: Secondary | ICD-10-CM | POA: Diagnosis not present

## 2017-07-27 DIAGNOSIS — C3490 Malignant neoplasm of unspecified part of unspecified bronchus or lung: Secondary | ICD-10-CM | POA: Diagnosis not present

## 2017-07-27 DIAGNOSIS — Z86718 Personal history of other venous thrombosis and embolism: Secondary | ICD-10-CM | POA: Diagnosis not present

## 2017-07-28 DIAGNOSIS — B0233 Zoster keratitis: Secondary | ICD-10-CM | POA: Diagnosis not present

## 2017-07-28 DIAGNOSIS — C771 Secondary and unspecified malignant neoplasm of intrathoracic lymph nodes: Secondary | ICD-10-CM | POA: Diagnosis not present

## 2017-07-28 DIAGNOSIS — K208 Other esophagitis: Secondary | ICD-10-CM | POA: Diagnosis not present

## 2017-07-28 DIAGNOSIS — K219 Gastro-esophageal reflux disease without esophagitis: Secondary | ICD-10-CM | POA: Diagnosis not present

## 2017-07-28 DIAGNOSIS — I2510 Atherosclerotic heart disease of native coronary artery without angina pectoris: Secondary | ICD-10-CM | POA: Diagnosis not present

## 2017-07-28 DIAGNOSIS — Z9981 Dependence on supplemental oxygen: Secondary | ICD-10-CM | POA: Diagnosis not present

## 2017-07-28 DIAGNOSIS — G500 Trigeminal neuralgia: Secondary | ICD-10-CM | POA: Diagnosis not present

## 2017-07-28 DIAGNOSIS — I10 Essential (primary) hypertension: Secondary | ICD-10-CM | POA: Diagnosis not present

## 2017-07-28 DIAGNOSIS — Z7982 Long term (current) use of aspirin: Secondary | ICD-10-CM | POA: Diagnosis not present

## 2017-07-28 DIAGNOSIS — Z79891 Long term (current) use of opiate analgesic: Secondary | ICD-10-CM | POA: Diagnosis not present

## 2017-07-28 DIAGNOSIS — Y842 Radiological procedure and radiotherapy as the cause of abnormal reaction of the patient, or of later complication, without mention of misadventure at the time of the procedure: Secondary | ICD-10-CM | POA: Diagnosis not present

## 2017-07-28 DIAGNOSIS — C3492 Malignant neoplasm of unspecified part of left bronchus or lung: Secondary | ICD-10-CM | POA: Diagnosis not present

## 2017-07-29 DIAGNOSIS — D509 Iron deficiency anemia, unspecified: Secondary | ICD-10-CM | POA: Diagnosis not present

## 2017-07-29 DIAGNOSIS — R531 Weakness: Secondary | ICD-10-CM | POA: Diagnosis not present

## 2017-07-29 DIAGNOSIS — Z86718 Personal history of other venous thrombosis and embolism: Secondary | ICD-10-CM | POA: Diagnosis not present

## 2017-07-29 DIAGNOSIS — C771 Secondary and unspecified malignant neoplasm of intrathoracic lymph nodes: Secondary | ICD-10-CM | POA: Diagnosis not present

## 2017-07-29 DIAGNOSIS — I10 Essential (primary) hypertension: Secondary | ICD-10-CM | POA: Diagnosis not present

## 2017-07-29 DIAGNOSIS — E860 Dehydration: Secondary | ICD-10-CM | POA: Diagnosis not present

## 2017-07-29 DIAGNOSIS — I2510 Atherosclerotic heart disease of native coronary artery without angina pectoris: Secondary | ICD-10-CM | POA: Diagnosis not present

## 2017-07-29 DIAGNOSIS — Z7901 Long term (current) use of anticoagulants: Secondary | ICD-10-CM | POA: Diagnosis not present

## 2017-07-29 DIAGNOSIS — R3915 Urgency of urination: Secondary | ICD-10-CM | POA: Diagnosis not present

## 2017-07-29 DIAGNOSIS — C3490 Malignant neoplasm of unspecified part of unspecified bronchus or lung: Secondary | ICD-10-CM | POA: Diagnosis not present

## 2017-07-30 DIAGNOSIS — Z7901 Long term (current) use of anticoagulants: Secondary | ICD-10-CM | POA: Diagnosis not present

## 2017-07-30 DIAGNOSIS — Z5181 Encounter for therapeutic drug level monitoring: Secondary | ICD-10-CM | POA: Diagnosis not present

## 2017-07-30 DIAGNOSIS — D509 Iron deficiency anemia, unspecified: Secondary | ICD-10-CM | POA: Diagnosis not present

## 2017-07-30 DIAGNOSIS — Z86718 Personal history of other venous thrombosis and embolism: Secondary | ICD-10-CM | POA: Diagnosis not present

## 2017-07-30 DIAGNOSIS — C3482 Malignant neoplasm of overlapping sites of left bronchus and lung: Secondary | ICD-10-CM | POA: Diagnosis not present

## 2017-07-30 DIAGNOSIS — C771 Secondary and unspecified malignant neoplasm of intrathoracic lymph nodes: Secondary | ICD-10-CM | POA: Diagnosis not present

## 2017-07-30 DIAGNOSIS — I82403 Acute embolism and thrombosis of unspecified deep veins of lower extremity, bilateral: Secondary | ICD-10-CM | POA: Diagnosis not present

## 2017-07-30 DIAGNOSIS — E860 Dehydration: Secondary | ICD-10-CM | POA: Diagnosis not present

## 2017-07-30 DIAGNOSIS — C3490 Malignant neoplasm of unspecified part of unspecified bronchus or lung: Secondary | ICD-10-CM | POA: Diagnosis not present

## 2017-07-30 DIAGNOSIS — R531 Weakness: Secondary | ICD-10-CM | POA: Diagnosis not present

## 2017-08-01 DIAGNOSIS — C3490 Malignant neoplasm of unspecified part of unspecified bronchus or lung: Secondary | ICD-10-CM | POA: Diagnosis not present

## 2017-08-04 DIAGNOSIS — K219 Gastro-esophageal reflux disease without esophagitis: Secondary | ICD-10-CM | POA: Diagnosis not present

## 2017-08-04 DIAGNOSIS — C3492 Malignant neoplasm of unspecified part of left bronchus or lung: Secondary | ICD-10-CM | POA: Diagnosis not present

## 2017-08-04 DIAGNOSIS — Z79891 Long term (current) use of opiate analgesic: Secondary | ICD-10-CM | POA: Diagnosis not present

## 2017-08-04 DIAGNOSIS — Z7982 Long term (current) use of aspirin: Secondary | ICD-10-CM | POA: Diagnosis not present

## 2017-08-04 DIAGNOSIS — K208 Other esophagitis: Secondary | ICD-10-CM | POA: Diagnosis not present

## 2017-08-04 DIAGNOSIS — Y842 Radiological procedure and radiotherapy as the cause of abnormal reaction of the patient, or of later complication, without mention of misadventure at the time of the procedure: Secondary | ICD-10-CM | POA: Diagnosis not present

## 2017-08-04 DIAGNOSIS — B0233 Zoster keratitis: Secondary | ICD-10-CM | POA: Diagnosis not present

## 2017-08-04 DIAGNOSIS — G500 Trigeminal neuralgia: Secondary | ICD-10-CM | POA: Diagnosis not present

## 2017-08-04 DIAGNOSIS — I10 Essential (primary) hypertension: Secondary | ICD-10-CM | POA: Diagnosis not present

## 2017-08-04 DIAGNOSIS — Z9981 Dependence on supplemental oxygen: Secondary | ICD-10-CM | POA: Diagnosis not present

## 2017-08-04 DIAGNOSIS — C771 Secondary and unspecified malignant neoplasm of intrathoracic lymph nodes: Secondary | ICD-10-CM | POA: Diagnosis not present

## 2017-08-04 DIAGNOSIS — I2510 Atherosclerotic heart disease of native coronary artery without angina pectoris: Secondary | ICD-10-CM | POA: Diagnosis not present

## 2017-08-09 DIAGNOSIS — J181 Lobar pneumonia, unspecified organism: Secondary | ICD-10-CM | POA: Diagnosis not present

## 2017-08-09 DIAGNOSIS — I2510 Atherosclerotic heart disease of native coronary artery without angina pectoris: Secondary | ICD-10-CM | POA: Diagnosis not present

## 2017-08-09 DIAGNOSIS — I10 Essential (primary) hypertension: Secondary | ICD-10-CM | POA: Diagnosis not present

## 2017-08-09 DIAGNOSIS — J449 Chronic obstructive pulmonary disease, unspecified: Secondary | ICD-10-CM | POA: Diagnosis not present

## 2017-08-09 DIAGNOSIS — J439 Emphysema, unspecified: Secondary | ICD-10-CM | POA: Diagnosis not present

## 2017-08-11 DIAGNOSIS — K219 Gastro-esophageal reflux disease without esophagitis: Secondary | ICD-10-CM | POA: Diagnosis not present

## 2017-08-11 DIAGNOSIS — I10 Essential (primary) hypertension: Secondary | ICD-10-CM | POA: Diagnosis not present

## 2017-08-11 DIAGNOSIS — M79605 Pain in left leg: Secondary | ICD-10-CM | POA: Diagnosis not present

## 2017-08-11 DIAGNOSIS — M5136 Other intervertebral disc degeneration, lumbar region: Secondary | ICD-10-CM | POA: Diagnosis not present

## 2017-08-11 DIAGNOSIS — K208 Other esophagitis: Secondary | ICD-10-CM | POA: Diagnosis not present

## 2017-08-11 DIAGNOSIS — G500 Trigeminal neuralgia: Secondary | ICD-10-CM | POA: Diagnosis not present

## 2017-08-11 DIAGNOSIS — E785 Hyperlipidemia, unspecified: Secondary | ICD-10-CM | POA: Diagnosis not present

## 2017-08-11 DIAGNOSIS — C3490 Malignant neoplasm of unspecified part of unspecified bronchus or lung: Secondary | ICD-10-CM | POA: Diagnosis not present

## 2017-08-11 DIAGNOSIS — M899 Disorder of bone, unspecified: Secondary | ICD-10-CM | POA: Diagnosis not present

## 2017-08-11 DIAGNOSIS — Z87891 Personal history of nicotine dependence: Secondary | ICD-10-CM | POA: Diagnosis not present

## 2017-08-11 DIAGNOSIS — M545 Low back pain: Secondary | ICD-10-CM | POA: Diagnosis not present

## 2017-08-11 DIAGNOSIS — I2510 Atherosclerotic heart disease of native coronary artery without angina pectoris: Secondary | ICD-10-CM | POA: Diagnosis not present

## 2017-08-11 DIAGNOSIS — Z79899 Other long term (current) drug therapy: Secondary | ICD-10-CM | POA: Diagnosis not present

## 2017-08-11 DIAGNOSIS — M79604 Pain in right leg: Secondary | ICD-10-CM | POA: Diagnosis not present

## 2017-08-11 DIAGNOSIS — M48061 Spinal stenosis, lumbar region without neurogenic claudication: Secondary | ICD-10-CM | POA: Diagnosis not present

## 2017-08-11 DIAGNOSIS — Z9981 Dependence on supplemental oxygen: Secondary | ICD-10-CM | POA: Diagnosis not present

## 2017-08-11 DIAGNOSIS — Z7982 Long term (current) use of aspirin: Secondary | ICD-10-CM | POA: Diagnosis not present

## 2017-08-11 DIAGNOSIS — C771 Secondary and unspecified malignant neoplasm of intrathoracic lymph nodes: Secondary | ICD-10-CM | POA: Diagnosis not present

## 2017-08-11 DIAGNOSIS — M5126 Other intervertebral disc displacement, lumbar region: Secondary | ICD-10-CM | POA: Diagnosis not present

## 2017-08-11 DIAGNOSIS — M47896 Other spondylosis, lumbar region: Secondary | ICD-10-CM | POA: Diagnosis not present

## 2017-08-11 DIAGNOSIS — C3492 Malignant neoplasm of unspecified part of left bronchus or lung: Secondary | ICD-10-CM | POA: Diagnosis not present

## 2017-08-11 DIAGNOSIS — B0233 Zoster keratitis: Secondary | ICD-10-CM | POA: Diagnosis not present

## 2017-08-11 DIAGNOSIS — Z7901 Long term (current) use of anticoagulants: Secondary | ICD-10-CM | POA: Diagnosis not present

## 2017-08-11 DIAGNOSIS — Z888 Allergy status to other drugs, medicaments and biological substances status: Secondary | ICD-10-CM | POA: Diagnosis not present

## 2017-08-11 DIAGNOSIS — M5137 Other intervertebral disc degeneration, lumbosacral region: Secondary | ICD-10-CM | POA: Diagnosis not present

## 2017-08-11 DIAGNOSIS — Y842 Radiological procedure and radiotherapy as the cause of abnormal reaction of the patient, or of later complication, without mention of misadventure at the time of the procedure: Secondary | ICD-10-CM | POA: Diagnosis not present

## 2017-08-11 DIAGNOSIS — Z79891 Long term (current) use of opiate analgesic: Secondary | ICD-10-CM | POA: Diagnosis not present

## 2017-08-13 DIAGNOSIS — M899 Disorder of bone, unspecified: Secondary | ICD-10-CM | POA: Diagnosis not present

## 2017-08-13 DIAGNOSIS — Z7901 Long term (current) use of anticoagulants: Secondary | ICD-10-CM | POA: Diagnosis not present

## 2017-08-13 DIAGNOSIS — D509 Iron deficiency anemia, unspecified: Secondary | ICD-10-CM | POA: Diagnosis not present

## 2017-08-13 DIAGNOSIS — Z86718 Personal history of other venous thrombosis and embolism: Secondary | ICD-10-CM | POA: Diagnosis not present

## 2017-08-13 DIAGNOSIS — C3490 Malignant neoplasm of unspecified part of unspecified bronchus or lung: Secondary | ICD-10-CM | POA: Diagnosis not present

## 2017-08-13 DIAGNOSIS — E860 Dehydration: Secondary | ICD-10-CM | POA: Diagnosis not present

## 2017-08-13 DIAGNOSIS — C3482 Malignant neoplasm of overlapping sites of left bronchus and lung: Secondary | ICD-10-CM | POA: Diagnosis not present

## 2017-08-13 DIAGNOSIS — R531 Weakness: Secondary | ICD-10-CM | POA: Diagnosis not present

## 2017-08-13 DIAGNOSIS — I82403 Acute embolism and thrombosis of unspecified deep veins of lower extremity, bilateral: Secondary | ICD-10-CM | POA: Diagnosis not present

## 2017-08-13 DIAGNOSIS — C771 Secondary and unspecified malignant neoplasm of intrathoracic lymph nodes: Secondary | ICD-10-CM | POA: Diagnosis not present

## 2017-08-18 DIAGNOSIS — K208 Other esophagitis: Secondary | ICD-10-CM | POA: Diagnosis not present

## 2017-08-18 DIAGNOSIS — Z9981 Dependence on supplemental oxygen: Secondary | ICD-10-CM | POA: Diagnosis not present

## 2017-08-18 DIAGNOSIS — Z7982 Long term (current) use of aspirin: Secondary | ICD-10-CM | POA: Diagnosis not present

## 2017-08-18 DIAGNOSIS — Z79891 Long term (current) use of opiate analgesic: Secondary | ICD-10-CM | POA: Diagnosis not present

## 2017-08-18 DIAGNOSIS — Y842 Radiological procedure and radiotherapy as the cause of abnormal reaction of the patient, or of later complication, without mention of misadventure at the time of the procedure: Secondary | ICD-10-CM | POA: Diagnosis not present

## 2017-08-18 DIAGNOSIS — B0233 Zoster keratitis: Secondary | ICD-10-CM | POA: Diagnosis not present

## 2017-08-18 DIAGNOSIS — I2510 Atherosclerotic heart disease of native coronary artery without angina pectoris: Secondary | ICD-10-CM | POA: Diagnosis not present

## 2017-08-18 DIAGNOSIS — C771 Secondary and unspecified malignant neoplasm of intrathoracic lymph nodes: Secondary | ICD-10-CM | POA: Diagnosis not present

## 2017-08-18 DIAGNOSIS — C3492 Malignant neoplasm of unspecified part of left bronchus or lung: Secondary | ICD-10-CM | POA: Diagnosis not present

## 2017-08-18 DIAGNOSIS — K219 Gastro-esophageal reflux disease without esophagitis: Secondary | ICD-10-CM | POA: Diagnosis not present

## 2017-08-18 DIAGNOSIS — G500 Trigeminal neuralgia: Secondary | ICD-10-CM | POA: Diagnosis not present

## 2017-08-18 DIAGNOSIS — I10 Essential (primary) hypertension: Secondary | ICD-10-CM | POA: Diagnosis not present

## 2017-08-20 DIAGNOSIS — G500 Trigeminal neuralgia: Secondary | ICD-10-CM | POA: Diagnosis not present

## 2017-08-20 DIAGNOSIS — Z79891 Long term (current) use of opiate analgesic: Secondary | ICD-10-CM | POA: Diagnosis not present

## 2017-08-20 DIAGNOSIS — Y842 Radiological procedure and radiotherapy as the cause of abnormal reaction of the patient, or of later complication, without mention of misadventure at the time of the procedure: Secondary | ICD-10-CM | POA: Diagnosis not present

## 2017-08-20 DIAGNOSIS — Z7982 Long term (current) use of aspirin: Secondary | ICD-10-CM | POA: Diagnosis not present

## 2017-08-20 DIAGNOSIS — C771 Secondary and unspecified malignant neoplasm of intrathoracic lymph nodes: Secondary | ICD-10-CM | POA: Diagnosis not present

## 2017-08-20 DIAGNOSIS — Z9981 Dependence on supplemental oxygen: Secondary | ICD-10-CM | POA: Diagnosis not present

## 2017-08-20 DIAGNOSIS — K208 Other esophagitis: Secondary | ICD-10-CM | POA: Diagnosis not present

## 2017-08-20 DIAGNOSIS — I10 Essential (primary) hypertension: Secondary | ICD-10-CM | POA: Diagnosis not present

## 2017-08-20 DIAGNOSIS — I2510 Atherosclerotic heart disease of native coronary artery without angina pectoris: Secondary | ICD-10-CM | POA: Diagnosis not present

## 2017-08-20 DIAGNOSIS — C3492 Malignant neoplasm of unspecified part of left bronchus or lung: Secondary | ICD-10-CM | POA: Diagnosis not present

## 2017-08-20 DIAGNOSIS — B0233 Zoster keratitis: Secondary | ICD-10-CM | POA: Diagnosis not present

## 2017-08-20 DIAGNOSIS — K219 Gastro-esophageal reflux disease without esophagitis: Secondary | ICD-10-CM | POA: Diagnosis not present

## 2017-08-22 DIAGNOSIS — M25551 Pain in right hip: Secondary | ICD-10-CM | POA: Diagnosis not present

## 2017-08-22 DIAGNOSIS — I10 Essential (primary) hypertension: Secondary | ICD-10-CM | POA: Diagnosis not present

## 2017-08-22 DIAGNOSIS — Z888 Allergy status to other drugs, medicaments and biological substances status: Secondary | ICD-10-CM | POA: Diagnosis not present

## 2017-08-22 DIAGNOSIS — Z87891 Personal history of nicotine dependence: Secondary | ICD-10-CM | POA: Diagnosis not present

## 2017-08-22 DIAGNOSIS — Z7901 Long term (current) use of anticoagulants: Secondary | ICD-10-CM | POA: Diagnosis not present

## 2017-08-22 DIAGNOSIS — M25552 Pain in left hip: Secondary | ICD-10-CM | POA: Diagnosis not present

## 2017-08-22 DIAGNOSIS — C3490 Malignant neoplasm of unspecified part of unspecified bronchus or lung: Secondary | ICD-10-CM | POA: Diagnosis not present

## 2017-08-22 DIAGNOSIS — M25452 Effusion, left hip: Secondary | ICD-10-CM | POA: Diagnosis not present

## 2017-08-22 DIAGNOSIS — C7951 Secondary malignant neoplasm of bone: Secondary | ICD-10-CM | POA: Diagnosis not present

## 2017-08-22 DIAGNOSIS — Z79899 Other long term (current) drug therapy: Secondary | ICD-10-CM | POA: Diagnosis not present

## 2017-08-22 DIAGNOSIS — Z86718 Personal history of other venous thrombosis and embolism: Secondary | ICD-10-CM | POA: Diagnosis not present

## 2017-08-22 DIAGNOSIS — M898X5 Other specified disorders of bone, thigh: Secondary | ICD-10-CM | POA: Diagnosis not present

## 2017-08-24 DIAGNOSIS — Z79891 Long term (current) use of opiate analgesic: Secondary | ICD-10-CM | POA: Diagnosis not present

## 2017-08-24 DIAGNOSIS — I10 Essential (primary) hypertension: Secondary | ICD-10-CM | POA: Diagnosis not present

## 2017-08-24 DIAGNOSIS — I2510 Atherosclerotic heart disease of native coronary artery without angina pectoris: Secondary | ICD-10-CM | POA: Diagnosis not present

## 2017-08-24 DIAGNOSIS — C771 Secondary and unspecified malignant neoplasm of intrathoracic lymph nodes: Secondary | ICD-10-CM | POA: Diagnosis not present

## 2017-08-24 DIAGNOSIS — Z7982 Long term (current) use of aspirin: Secondary | ICD-10-CM | POA: Diagnosis not present

## 2017-08-24 DIAGNOSIS — Y842 Radiological procedure and radiotherapy as the cause of abnormal reaction of the patient, or of later complication, without mention of misadventure at the time of the procedure: Secondary | ICD-10-CM | POA: Diagnosis not present

## 2017-08-24 DIAGNOSIS — K208 Other esophagitis: Secondary | ICD-10-CM | POA: Diagnosis not present

## 2017-08-24 DIAGNOSIS — K219 Gastro-esophageal reflux disease without esophagitis: Secondary | ICD-10-CM | POA: Diagnosis not present

## 2017-08-24 DIAGNOSIS — G500 Trigeminal neuralgia: Secondary | ICD-10-CM | POA: Diagnosis not present

## 2017-08-24 DIAGNOSIS — Z9981 Dependence on supplemental oxygen: Secondary | ICD-10-CM | POA: Diagnosis not present

## 2017-08-24 DIAGNOSIS — C3492 Malignant neoplasm of unspecified part of left bronchus or lung: Secondary | ICD-10-CM | POA: Diagnosis not present

## 2017-08-24 DIAGNOSIS — B0233 Zoster keratitis: Secondary | ICD-10-CM | POA: Diagnosis not present

## 2017-08-25 DIAGNOSIS — Z888 Allergy status to other drugs, medicaments and biological substances status: Secondary | ICD-10-CM | POA: Diagnosis not present

## 2017-08-25 DIAGNOSIS — C3490 Malignant neoplasm of unspecified part of unspecified bronchus or lung: Secondary | ICD-10-CM | POA: Diagnosis not present

## 2017-08-25 DIAGNOSIS — C771 Secondary and unspecified malignant neoplasm of intrathoracic lymph nodes: Secondary | ICD-10-CM | POA: Diagnosis not present

## 2017-08-26 DIAGNOSIS — Z5111 Encounter for antineoplastic chemotherapy: Secondary | ICD-10-CM | POA: Diagnosis not present

## 2017-08-26 DIAGNOSIS — J439 Emphysema, unspecified: Secondary | ICD-10-CM | POA: Diagnosis not present

## 2017-08-26 DIAGNOSIS — Z79899 Other long term (current) drug therapy: Secondary | ICD-10-CM | POA: Diagnosis not present

## 2017-08-26 DIAGNOSIS — C3490 Malignant neoplasm of unspecified part of unspecified bronchus or lung: Secondary | ICD-10-CM | POA: Diagnosis not present

## 2017-08-26 DIAGNOSIS — G893 Neoplasm related pain (acute) (chronic): Secondary | ICD-10-CM | POA: Diagnosis not present

## 2017-08-26 DIAGNOSIS — C7951 Secondary malignant neoplasm of bone: Secondary | ICD-10-CM | POA: Diagnosis not present

## 2017-08-26 DIAGNOSIS — C3432 Malignant neoplasm of lower lobe, left bronchus or lung: Secondary | ICD-10-CM | POA: Diagnosis not present

## 2017-08-26 DIAGNOSIS — C771 Secondary and unspecified malignant neoplasm of intrathoracic lymph nodes: Secondary | ICD-10-CM | POA: Diagnosis not present

## 2017-08-26 DIAGNOSIS — I82403 Acute embolism and thrombosis of unspecified deep veins of lower extremity, bilateral: Secondary | ICD-10-CM | POA: Diagnosis not present

## 2017-08-30 DIAGNOSIS — M25559 Pain in unspecified hip: Secondary | ICD-10-CM | POA: Diagnosis not present

## 2017-08-30 DIAGNOSIS — C7951 Secondary malignant neoplasm of bone: Secondary | ICD-10-CM | POA: Diagnosis not present

## 2017-08-30 DIAGNOSIS — I2510 Atherosclerotic heart disease of native coronary artery without angina pectoris: Secondary | ICD-10-CM | POA: Diagnosis not present

## 2017-08-30 DIAGNOSIS — M79652 Pain in left thigh: Secondary | ICD-10-CM | POA: Diagnosis not present

## 2017-08-30 DIAGNOSIS — M79651 Pain in right thigh: Secondary | ICD-10-CM | POA: Diagnosis not present

## 2017-08-30 DIAGNOSIS — M898X5 Other specified disorders of bone, thigh: Secondary | ICD-10-CM | POA: Diagnosis not present

## 2017-08-30 DIAGNOSIS — I10 Essential (primary) hypertension: Secondary | ICD-10-CM | POA: Diagnosis not present

## 2017-09-02 DIAGNOSIS — C3492 Malignant neoplasm of unspecified part of left bronchus or lung: Secondary | ICD-10-CM | POA: Diagnosis not present

## 2017-09-02 DIAGNOSIS — K208 Other esophagitis: Secondary | ICD-10-CM | POA: Diagnosis not present

## 2017-09-02 DIAGNOSIS — B0233 Zoster keratitis: Secondary | ICD-10-CM | POA: Diagnosis not present

## 2017-09-02 DIAGNOSIS — Y842 Radiological procedure and radiotherapy as the cause of abnormal reaction of the patient, or of later complication, without mention of misadventure at the time of the procedure: Secondary | ICD-10-CM | POA: Diagnosis not present

## 2017-09-02 DIAGNOSIS — I10 Essential (primary) hypertension: Secondary | ICD-10-CM | POA: Diagnosis not present

## 2017-09-02 DIAGNOSIS — G500 Trigeminal neuralgia: Secondary | ICD-10-CM | POA: Diagnosis not present

## 2017-09-02 DIAGNOSIS — Z79891 Long term (current) use of opiate analgesic: Secondary | ICD-10-CM | POA: Diagnosis not present

## 2017-09-02 DIAGNOSIS — K219 Gastro-esophageal reflux disease without esophagitis: Secondary | ICD-10-CM | POA: Diagnosis not present

## 2017-09-02 DIAGNOSIS — I2510 Atherosclerotic heart disease of native coronary artery without angina pectoris: Secondary | ICD-10-CM | POA: Diagnosis not present

## 2017-09-02 DIAGNOSIS — C771 Secondary and unspecified malignant neoplasm of intrathoracic lymph nodes: Secondary | ICD-10-CM | POA: Diagnosis not present

## 2017-09-02 DIAGNOSIS — Z7982 Long term (current) use of aspirin: Secondary | ICD-10-CM | POA: Diagnosis not present

## 2017-09-02 DIAGNOSIS — Z9981 Dependence on supplemental oxygen: Secondary | ICD-10-CM | POA: Diagnosis not present

## 2017-09-03 ENCOUNTER — Other Ambulatory Visit: Payer: Self-pay | Admitting: Family Medicine

## 2017-09-03 ENCOUNTER — Encounter: Payer: Self-pay | Admitting: *Deleted

## 2017-09-03 DIAGNOSIS — G893 Neoplasm related pain (acute) (chronic): Secondary | ICD-10-CM | POA: Diagnosis not present

## 2017-09-03 DIAGNOSIS — Z85118 Personal history of other malignant neoplasm of bronchus and lung: Secondary | ICD-10-CM | POA: Diagnosis not present

## 2017-09-03 DIAGNOSIS — C7800 Secondary malignant neoplasm of unspecified lung: Secondary | ICD-10-CM | POA: Diagnosis not present

## 2017-09-03 DIAGNOSIS — Z0389 Encounter for observation for other suspected diseases and conditions ruled out: Secondary | ICD-10-CM | POA: Diagnosis not present

## 2017-09-03 DIAGNOSIS — J9811 Atelectasis: Secondary | ICD-10-CM | POA: Diagnosis not present

## 2017-09-03 DIAGNOSIS — Z9889 Other specified postprocedural states: Secondary | ICD-10-CM | POA: Diagnosis not present

## 2017-09-03 DIAGNOSIS — J431 Panlobular emphysema: Secondary | ICD-10-CM | POA: Diagnosis not present

## 2017-09-03 DIAGNOSIS — Z79899 Other long term (current) drug therapy: Secondary | ICD-10-CM | POA: Diagnosis not present

## 2017-09-03 DIAGNOSIS — C7952 Secondary malignant neoplasm of bone marrow: Secondary | ICD-10-CM | POA: Diagnosis not present

## 2017-09-03 DIAGNOSIS — Z471 Aftercare following joint replacement surgery: Secondary | ICD-10-CM | POA: Diagnosis not present

## 2017-09-03 DIAGNOSIS — C799 Secondary malignant neoplasm of unspecified site: Secondary | ICD-10-CM | POA: Diagnosis not present

## 2017-09-03 DIAGNOSIS — J398 Other specified diseases of upper respiratory tract: Secondary | ICD-10-CM | POA: Diagnosis not present

## 2017-09-03 DIAGNOSIS — I10 Essential (primary) hypertension: Secondary | ICD-10-CM | POA: Diagnosis not present

## 2017-09-03 DIAGNOSIS — C771 Secondary and unspecified malignant neoplasm of intrathoracic lymph nodes: Secondary | ICD-10-CM | POA: Diagnosis not present

## 2017-09-03 DIAGNOSIS — C3490 Malignant neoplasm of unspecified part of unspecified bronchus or lung: Secondary | ICD-10-CM | POA: Diagnosis not present

## 2017-09-03 DIAGNOSIS — I2699 Other pulmonary embolism without acute cor pulmonale: Secondary | ICD-10-CM | POA: Diagnosis not present

## 2017-09-03 DIAGNOSIS — M89551 Osteolysis, right thigh: Secondary | ICD-10-CM | POA: Diagnosis not present

## 2017-09-03 DIAGNOSIS — Z8619 Personal history of other infectious and parasitic diseases: Secondary | ICD-10-CM | POA: Diagnosis not present

## 2017-09-03 DIAGNOSIS — R2231 Localized swelling, mass and lump, right upper limb: Secondary | ICD-10-CM | POA: Diagnosis not present

## 2017-09-03 DIAGNOSIS — C761 Malignant neoplasm of thorax: Secondary | ICD-10-CM | POA: Diagnosis not present

## 2017-09-03 DIAGNOSIS — J90 Pleural effusion, not elsewhere classified: Secondary | ICD-10-CM | POA: Diagnosis not present

## 2017-09-03 DIAGNOSIS — Z951 Presence of aortocoronary bypass graft: Secondary | ICD-10-CM | POA: Diagnosis not present

## 2017-09-03 DIAGNOSIS — Z923 Personal history of irradiation: Secondary | ICD-10-CM | POA: Diagnosis not present

## 2017-09-03 DIAGNOSIS — M1612 Unilateral primary osteoarthritis, left hip: Secondary | ICD-10-CM | POA: Diagnosis not present

## 2017-09-03 DIAGNOSIS — Z955 Presence of coronary angioplasty implant and graft: Secondary | ICD-10-CM | POA: Diagnosis not present

## 2017-09-03 DIAGNOSIS — M25512 Pain in left shoulder: Secondary | ICD-10-CM | POA: Diagnosis not present

## 2017-09-03 DIAGNOSIS — I2510 Atherosclerotic heart disease of native coronary artery without angina pectoris: Secondary | ICD-10-CM | POA: Diagnosis not present

## 2017-09-03 DIAGNOSIS — Z79891 Long term (current) use of opiate analgesic: Secondary | ICD-10-CM | POA: Diagnosis not present

## 2017-09-03 DIAGNOSIS — M79631 Pain in right forearm: Secondary | ICD-10-CM | POA: Diagnosis not present

## 2017-09-03 DIAGNOSIS — Z87891 Personal history of nicotine dependence: Secondary | ICD-10-CM | POA: Diagnosis not present

## 2017-09-03 DIAGNOSIS — Z7901 Long term (current) use of anticoagulants: Secondary | ICD-10-CM | POA: Diagnosis not present

## 2017-09-03 DIAGNOSIS — Z96642 Presence of left artificial hip joint: Secondary | ICD-10-CM | POA: Diagnosis not present

## 2017-09-03 DIAGNOSIS — D849 Immunodeficiency, unspecified: Secondary | ICD-10-CM | POA: Diagnosis not present

## 2017-09-03 DIAGNOSIS — N200 Calculus of kidney: Secondary | ICD-10-CM | POA: Diagnosis not present

## 2017-09-03 DIAGNOSIS — M84451A Pathological fracture, right femur, initial encounter for fracture: Secondary | ICD-10-CM | POA: Diagnosis not present

## 2017-09-03 DIAGNOSIS — D62 Acute posthemorrhagic anemia: Secondary | ICD-10-CM | POA: Diagnosis not present

## 2017-09-03 DIAGNOSIS — M19012 Primary osteoarthritis, left shoulder: Secondary | ICD-10-CM | POA: Diagnosis not present

## 2017-09-03 DIAGNOSIS — C7951 Secondary malignant neoplasm of bone: Secondary | ICD-10-CM | POA: Diagnosis not present

## 2017-09-03 DIAGNOSIS — J439 Emphysema, unspecified: Secondary | ICD-10-CM | POA: Diagnosis not present

## 2017-09-03 DIAGNOSIS — R918 Other nonspecific abnormal finding of lung field: Secondary | ICD-10-CM | POA: Diagnosis not present

## 2017-09-03 DIAGNOSIS — I25810 Atherosclerosis of coronary artery bypass graft(s) without angina pectoris: Secondary | ICD-10-CM | POA: Diagnosis not present

## 2017-09-03 DIAGNOSIS — M79621 Pain in right upper arm: Secondary | ICD-10-CM | POA: Diagnosis not present

## 2017-09-03 DIAGNOSIS — K5900 Constipation, unspecified: Secondary | ICD-10-CM | POA: Diagnosis not present

## 2017-09-03 DIAGNOSIS — J9601 Acute respiratory failure with hypoxia: Secondary | ICD-10-CM | POA: Diagnosis not present

## 2017-09-03 DIAGNOSIS — D649 Anemia, unspecified: Secondary | ICD-10-CM | POA: Diagnosis not present

## 2017-09-03 DIAGNOSIS — G500 Trigeminal neuralgia: Secondary | ICD-10-CM | POA: Diagnosis not present

## 2017-09-03 DIAGNOSIS — C773 Secondary and unspecified malignant neoplasm of axilla and upper limb lymph nodes: Secondary | ICD-10-CM | POA: Diagnosis not present

## 2017-09-03 DIAGNOSIS — E785 Hyperlipidemia, unspecified: Secondary | ICD-10-CM | POA: Diagnosis not present

## 2017-09-03 NOTE — Telephone Encounter (Signed)
RK-Here is a refill req for your pt/thx dmf

## 2017-09-03 NOTE — Telephone Encounter (Signed)
Per provider must sched appt first/thx dmf

## 2017-09-03 NOTE — Telephone Encounter (Signed)
I have not seen Ms. [name] for her HTN. She established with headaches and eye pain, which we were working up when she was diagnosed with lung cancer. Please have her schedule an appt.

## 2017-09-16 DIAGNOSIS — Z515 Encounter for palliative care: Secondary | ICD-10-CM | POA: Diagnosis not present

## 2017-09-16 DIAGNOSIS — Z9221 Personal history of antineoplastic chemotherapy: Secondary | ICD-10-CM | POA: Diagnosis not present

## 2017-09-16 DIAGNOSIS — R000 Tachycardia, unspecified: Secondary | ICD-10-CM | POA: Diagnosis not present

## 2017-09-16 DIAGNOSIS — J189 Pneumonia, unspecified organism: Secondary | ICD-10-CM | POA: Diagnosis not present

## 2017-09-16 DIAGNOSIS — Z66 Do not resuscitate: Secondary | ICD-10-CM | POA: Diagnosis not present

## 2017-09-16 DIAGNOSIS — C3490 Malignant neoplasm of unspecified part of unspecified bronchus or lung: Secondary | ICD-10-CM | POA: Diagnosis not present

## 2017-09-16 DIAGNOSIS — L89152 Pressure ulcer of sacral region, stage 2: Secondary | ICD-10-CM | POA: Diagnosis not present

## 2017-09-16 DIAGNOSIS — R05 Cough: Secondary | ICD-10-CM | POA: Diagnosis not present

## 2017-09-16 DIAGNOSIS — I2699 Other pulmonary embolism without acute cor pulmonale: Secondary | ICD-10-CM | POA: Diagnosis not present

## 2017-09-16 DIAGNOSIS — J9811 Atelectasis: Secondary | ICD-10-CM | POA: Diagnosis not present

## 2017-09-16 DIAGNOSIS — Z79899 Other long term (current) drug therapy: Secondary | ICD-10-CM | POA: Diagnosis not present

## 2017-09-16 DIAGNOSIS — Z7189 Other specified counseling: Secondary | ICD-10-CM | POA: Diagnosis not present

## 2017-09-16 DIAGNOSIS — I2782 Chronic pulmonary embolism: Secondary | ICD-10-CM | POA: Diagnosis not present

## 2017-09-16 DIAGNOSIS — Z5111 Encounter for antineoplastic chemotherapy: Secondary | ICD-10-CM | POA: Diagnosis not present

## 2017-09-16 DIAGNOSIS — C3412 Malignant neoplasm of upper lobe, left bronchus or lung: Secondary | ICD-10-CM | POA: Diagnosis not present

## 2017-09-16 DIAGNOSIS — K5900 Constipation, unspecified: Secondary | ICD-10-CM | POA: Diagnosis not present

## 2017-09-16 DIAGNOSIS — Z87891 Personal history of nicotine dependence: Secondary | ICD-10-CM | POA: Diagnosis not present

## 2017-09-16 DIAGNOSIS — R063 Periodic breathing: Secondary | ICD-10-CM | POA: Diagnosis not present

## 2017-09-16 DIAGNOSIS — R918 Other nonspecific abnormal finding of lung field: Secondary | ICD-10-CM | POA: Diagnosis not present

## 2017-09-16 DIAGNOSIS — M79605 Pain in left leg: Secondary | ICD-10-CM | POA: Diagnosis not present

## 2017-09-16 DIAGNOSIS — G893 Neoplasm related pain (acute) (chronic): Secondary | ICD-10-CM | POA: Diagnosis not present

## 2017-09-16 DIAGNOSIS — I700 Atherosclerosis of aorta: Secondary | ICD-10-CM | POA: Diagnosis not present

## 2017-09-16 DIAGNOSIS — I2510 Atherosclerotic heart disease of native coronary artery without angina pectoris: Secondary | ICD-10-CM | POA: Diagnosis not present

## 2017-09-16 DIAGNOSIS — C7800 Secondary malignant neoplasm of unspecified lung: Secondary | ICD-10-CM | POA: Diagnosis not present

## 2017-09-16 DIAGNOSIS — G8929 Other chronic pain: Secondary | ICD-10-CM | POA: Diagnosis not present

## 2017-09-16 DIAGNOSIS — C7951 Secondary malignant neoplasm of bone: Secondary | ICD-10-CM | POA: Diagnosis not present

## 2017-09-16 DIAGNOSIS — D509 Iron deficiency anemia, unspecified: Secondary | ICD-10-CM | POA: Diagnosis not present

## 2017-09-16 DIAGNOSIS — R0902 Hypoxemia: Secondary | ICD-10-CM | POA: Diagnosis not present

## 2017-09-16 DIAGNOSIS — Z86718 Personal history of other venous thrombosis and embolism: Secondary | ICD-10-CM | POA: Diagnosis not present

## 2017-09-16 DIAGNOSIS — M79604 Pain in right leg: Secondary | ICD-10-CM | POA: Diagnosis not present

## 2017-09-16 DIAGNOSIS — Z7901 Long term (current) use of anticoagulants: Secondary | ICD-10-CM | POA: Diagnosis not present

## 2017-09-16 DIAGNOSIS — J90 Pleural effusion, not elsewhere classified: Secondary | ICD-10-CM | POA: Diagnosis not present

## 2017-09-16 DIAGNOSIS — C771 Secondary and unspecified malignant neoplasm of intrathoracic lymph nodes: Secondary | ICD-10-CM | POA: Diagnosis not present

## 2017-09-16 DIAGNOSIS — K589 Irritable bowel syndrome without diarrhea: Secondary | ICD-10-CM | POA: Diagnosis not present

## 2017-09-16 DIAGNOSIS — Z471 Aftercare following joint replacement surgery: Secondary | ICD-10-CM | POA: Diagnosis not present

## 2017-09-16 DIAGNOSIS — Z7952 Long term (current) use of systemic steroids: Secondary | ICD-10-CM | POA: Diagnosis not present

## 2017-09-16 DIAGNOSIS — R0989 Other specified symptoms and signs involving the circulatory and respiratory systems: Secondary | ICD-10-CM | POA: Diagnosis not present

## 2017-09-16 DIAGNOSIS — Z888 Allergy status to other drugs, medicaments and biological substances status: Secondary | ICD-10-CM | POA: Diagnosis not present

## 2017-09-16 DIAGNOSIS — R0602 Shortness of breath: Secondary | ICD-10-CM | POA: Diagnosis not present

## 2017-09-16 DIAGNOSIS — Z483 Aftercare following surgery for neoplasm: Secondary | ICD-10-CM | POA: Diagnosis not present

## 2017-09-16 DIAGNOSIS — R0600 Dyspnea, unspecified: Secondary | ICD-10-CM | POA: Diagnosis not present

## 2017-09-16 DIAGNOSIS — J156 Pneumonia due to other aerobic Gram-negative bacteria: Secondary | ICD-10-CM | POA: Diagnosis not present

## 2017-09-16 DIAGNOSIS — J9601 Acute respiratory failure with hypoxia: Secondary | ICD-10-CM | POA: Diagnosis not present

## 2017-09-16 DIAGNOSIS — I10 Essential (primary) hypertension: Secondary | ICD-10-CM | POA: Diagnosis not present

## 2017-09-16 DIAGNOSIS — D649 Anemia, unspecified: Secondary | ICD-10-CM | POA: Diagnosis not present

## 2017-09-16 DIAGNOSIS — R451 Restlessness and agitation: Secondary | ICD-10-CM | POA: Diagnosis not present

## 2017-09-16 DIAGNOSIS — R911 Solitary pulmonary nodule: Secondary | ICD-10-CM | POA: Diagnosis not present

## 2017-09-16 DIAGNOSIS — R509 Fever, unspecified: Secondary | ICD-10-CM | POA: Diagnosis not present

## 2017-09-16 DIAGNOSIS — Z955 Presence of coronary angioplasty implant and graft: Secondary | ICD-10-CM | POA: Diagnosis not present

## 2017-09-16 DIAGNOSIS — J439 Emphysema, unspecified: Secondary | ICD-10-CM | POA: Diagnosis not present

## 2017-09-17 DIAGNOSIS — J9601 Acute respiratory failure with hypoxia: Secondary | ICD-10-CM | POA: Diagnosis not present

## 2017-09-17 DIAGNOSIS — J439 Emphysema, unspecified: Secondary | ICD-10-CM | POA: Diagnosis not present

## 2017-09-17 DIAGNOSIS — I10 Essential (primary) hypertension: Secondary | ICD-10-CM | POA: Diagnosis not present

## 2017-09-17 DIAGNOSIS — C7951 Secondary malignant neoplasm of bone: Secondary | ICD-10-CM | POA: Diagnosis not present

## 2017-09-17 DIAGNOSIS — Z955 Presence of coronary angioplasty implant and graft: Secondary | ICD-10-CM | POA: Diagnosis not present

## 2017-09-17 DIAGNOSIS — I2699 Other pulmonary embolism without acute cor pulmonale: Secondary | ICD-10-CM | POA: Diagnosis not present

## 2017-09-17 DIAGNOSIS — Z471 Aftercare following joint replacement surgery: Secondary | ICD-10-CM | POA: Diagnosis not present

## 2017-09-17 DIAGNOSIS — I2510 Atherosclerotic heart disease of native coronary artery without angina pectoris: Secondary | ICD-10-CM | POA: Diagnosis not present

## 2017-09-17 DIAGNOSIS — Z7901 Long term (current) use of anticoagulants: Secondary | ICD-10-CM | POA: Diagnosis not present

## 2017-09-17 DIAGNOSIS — Z87891 Personal history of nicotine dependence: Secondary | ICD-10-CM | POA: Diagnosis not present

## 2017-09-17 DIAGNOSIS — Z483 Aftercare following surgery for neoplasm: Secondary | ICD-10-CM | POA: Diagnosis not present

## 2017-09-17 DIAGNOSIS — Z7952 Long term (current) use of systemic steroids: Secondary | ICD-10-CM | POA: Diagnosis not present

## 2017-09-17 DIAGNOSIS — C3490 Malignant neoplasm of unspecified part of unspecified bronchus or lung: Secondary | ICD-10-CM | POA: Diagnosis not present

## 2017-10-22 ENCOUNTER — Other Ambulatory Visit: Payer: Self-pay | Admitting: Family Medicine

## 2017-10-23 DEATH — deceased

## 2022-01-21 DEATH — deceased
